# Patient Record
Sex: Male | Born: 1970 | Race: Black or African American | Hispanic: No | Marital: Single | Smoking: Current some day smoker
Health system: Southern US, Community
[De-identification: ages and names within clinical notes are randomized; demographics above are authoritative.]

## PROBLEM LIST (undated history)

## (undated) DIAGNOSIS — E785 Hyperlipidemia, unspecified: Secondary | ICD-10-CM

## (undated) DIAGNOSIS — N189 Chronic kidney disease, unspecified: Secondary | ICD-10-CM

## (undated) DIAGNOSIS — I10 Essential (primary) hypertension: Secondary | ICD-10-CM

## (undated) HISTORY — DX: Hyperlipidemia, unspecified: E78.5

---
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
## 2010-01-30 ENCOUNTER — Emergency Department (HOSPITAL_COMMUNITY)
Admission: EM | Admit: 2010-01-30 | Discharge: 2010-01-30 | Payer: Self-pay | Source: Home / Self Care | Admitting: Emergency Medicine

## 2010-02-04 ENCOUNTER — Emergency Department (HOSPITAL_COMMUNITY)
Admission: EM | Admit: 2010-02-04 | Discharge: 2010-02-04 | Payer: Self-pay | Source: Home / Self Care | Admitting: Emergency Medicine

## 2010-07-30 ENCOUNTER — Inpatient Hospital Stay (INDEPENDENT_AMBULATORY_CARE_PROVIDER_SITE_OTHER)
Admission: RE | Admit: 2010-07-30 | Discharge: 2010-07-30 | Disposition: A | Discharge status: Home / Self Care | Payer: 59 | Source: Ambulatory Visit | Attending: Emergency Medicine | Admitting: Emergency Medicine

## 2010-07-30 DIAGNOSIS — M722 Plantar fascial fibromatosis: Secondary | ICD-10-CM

## 2011-08-12 ENCOUNTER — Ambulatory Visit: Payer: 59 | Admitting: Physician Assistant

## 2011-08-12 VITALS — BP 122/78 | HR 60 | Temp 98.4°F | Resp 16 | Ht 67.5 in | Wt 184.0 lb

## 2011-08-12 DIAGNOSIS — M542 Cervicalgia: Secondary | ICD-10-CM

## 2011-08-12 DIAGNOSIS — L0211 Cutaneous abscess of neck: Secondary | ICD-10-CM

## 2011-08-12 DIAGNOSIS — L03221 Cellulitis of neck: Secondary | ICD-10-CM

## 2011-08-12 MED ORDER — DOXYCYCLINE HYCLATE 100 MG PO CAPS: 100.0000 mg | ORAL_CAPSULE | Freq: Two times a day (BID) | ORAL | Status: AC

## 2011-08-12 NOTE — Progress Notes (Signed)
  Subjective:    [name], male    DOB: Mar 05, 1970, 41 y.o.   MRN: [id number]  HPI Patient presents with bump on left side of her neck x 1 week. He first noticed it after getting his hair cut by a new barber.  Says it started as a small pimple bump but then has grown and increased in size.  A friend of his used a pair of tweezers to dig out a hair and after then it has gotten bigger with now a central area of pus.  No fevers, chills, neck pain, nausea, or vomiting.      Review of Systems  All other systems reviewed and are negative.       Objective:   Physical Exam  Constitutional: He is oriented to person, place, and time. He appears well-developed and well-nourished.  HENT:  Head: Normocephalic and atraumatic.  Right Ear: External ear normal.  Left Ear: External ear normal.  Eyes: Conjunctivae are normal.  Neck: Normal range of motion.  Cardiovascular: Normal rate, regular rhythm and normal heart sounds.   Pulmonary/Chest: Effort normal and breath sounds normal.  Musculoskeletal: Normal range of motion.  Lymphadenopathy:    He has no cervical adenopathy.  Neurological: He is alert and oriented to person, place, and time.  Skin:     Psychiatric: He has a normal mood and affect. His behavior is normal. Judgment and thought content normal.    [name] MRN: [id number], DOB: [birth year], 41 y.o. Date of Encounter: 08/12/2011, 8:05 PM    PROCEDURE NOTE: Verbal consent obtained. Betadine prep per usual protocol. Local anesthesia obtained with 2% lidocaine plain.  1 cm incision made with 11 blade along lesion.  Culture taken. Minimal purulence expressed. Lesion explored revealing no loculations. Dressed. Wound care instructions including precautions with patient. Patient tolerated the procedure well. Follow up as needed.      Grier Mitts, PA-C 08/12/2011 8:05 PM       Assessment & Plan:   1. Cellulitis, neck  doxycycline  (VIBRAMYCIN) 100 MG capsule, Wound culture   Start doxycycline 100 mg bid x 10 days.  Warm compresses to area.   Follow up if symptoms worsening or fail to improve.

## 2011-08-16 LAB — WOUND CULTURE
Gram Stain: NONE SEEN
Gram Stain: NONE SEEN
Gram Stain: NONE SEEN

## 2011-12-12 ENCOUNTER — Emergency Department (HOSPITAL_COMMUNITY)
Admission: EM | Admit: 2011-12-12 | Discharge: 2011-12-12 | Disposition: A | Discharge status: Home / Self Care | Payer: 59 | Attending: Emergency Medicine | Admitting: Emergency Medicine

## 2011-12-12 ENCOUNTER — Encounter (HOSPITAL_COMMUNITY): Payer: Self-pay | Admitting: *Deleted

## 2011-12-12 DIAGNOSIS — L03211 Cellulitis of face: Secondary | ICD-10-CM | POA: Insufficient documentation

## 2011-12-12 DIAGNOSIS — H5789 Other specified disorders of eye and adnexa: Secondary | ICD-10-CM | POA: Insufficient documentation

## 2011-12-12 DIAGNOSIS — F17200 Nicotine dependence, unspecified, uncomplicated: Secondary | ICD-10-CM | POA: Insufficient documentation

## 2011-12-12 DIAGNOSIS — L03213 Periorbital cellulitis: Secondary | ICD-10-CM

## 2011-12-12 DIAGNOSIS — L0201 Cutaneous abscess of face: Secondary | ICD-10-CM | POA: Insufficient documentation

## 2011-12-12 MED ORDER — CEPHALEXIN 500 MG PO CAPS: 500.0000 mg | ORAL_CAPSULE | Freq: Four times a day (QID) | ORAL | Status: DC

## 2011-12-12 MED ORDER — PREDNISONE 10 MG PO TABS: 20.0000 mg | ORAL_TABLET | Freq: Two times a day (BID) | ORAL | Status: DC

## 2011-12-12 NOTE — ED Notes (Signed)
Patient has swelling and redness to the right eye.  He states he noted onset of itching in the eye.  On Friday the eye was irritated and the swelling has progressed.  Patient states he has not drainage.  Denies pain.  Patient works in a nursing home.  He does not recall for sure if a foreign substance from a resident may have gotten in his face

## 2011-12-12 NOTE — Discharge Instructions (Signed)
Keflex and prednisone as prescribed.  Also take benadryl 50 mg three times daily for the next three days.  This medication may make you drowsy so avoid driving while taking this.   Orbital Cellulitis  Orbital cellulitis is an infection of the soft tissue of the orbit without abscess formation. The eye socket is the area around and behind the eye. It usually comes on suddenly in children, but can happen at any age. This condition can lead to death if untreated.  CAUSES   A germ (bacterial) infection of the area around and behind the eye.  Long-term (chronic) sinus infections.  An object (foreign body) stuck behind the eye.  An injury that goes through (penetrates) to tissues of the eyelids.  Trauma with secondary infection.  Fracture of the boney wall or floor of the orbit.  Serious eyelid infections.  Bite wounds.  Inflammation or infection of the lining membranes of the brain (meningitis).  Blood poisoning or infection (septicemia).  Dental area filled with pus (abscesses).  Severe uncontrolled diabetes (ketoacidosis). SYMPTOMS   Pain in the eye.  Redness and puffiness (swelling) of the eyelids. The swelling is often bad enough that the eye has to close.  Fever and feeling generally ill.  The lids and the cheek may be very red, hot and swollen.  A drop in vision.  Pain when touching the area around the eye.  The eye itself may look like it is "popping out" (proptosis).  Double vision  seeing two of everything (diplopia). DIAGNOSIS  An ophthalmologist can tell you if you have orbital cellulitis during an eye exam. It is important to know if the infection goes into the area behind the eye. True orbital cellulitis is a medical emergency. A CT scan may be needed to see if the sinuses are involved. A CT scan can also see if an abscess has formed behind the eye. TREATMENT  Orbital cellulitis should be treated in the hospital with medicine that kills germs (antibiotics).  These antibiotics are given right into the bloodstream through a vein (intravenous). SEEK IMMEDIATE MEDICAL CARE IF:   You have red, swollen eyelids.  You have a fever.  You develop double vision. MAKE SURE YOU:   Understand these instructions.  Will watch your condition.  Will get help right away if you are not doing well or get worse. Document Released: 12/29/2000 Document Revised: 03/29/2011 Document Reviewed: 05/01/2007 [facility] Patient Information 2013 [place], [place].

## 2011-12-12 NOTE — ED Provider Notes (Signed)
History  This chart was scribed for Geoffery Lyons, MD by Shari Heritage, ED Scribe. The patient was seen in room TR06C/TR06C. Patient's care was started at 1101.   CSN: [id number]  Arrival date & time 12/12/11  4098   First MD Initiated Contact with Patient 12/12/11 1101      Chief Complaint  Patient presents with  . Facial Swelling    The history is provided by the patient. No language interpreter was used.   HPI Comments: Junie Gulbrandson is a 41 y.o. male who presents to the Emergency Department complaining of right eyelid swelling, redness and itching with mild tearing onset 3 days ago. Patient denies any pain, blurry vision, double vision or other vision changes. He is having no symptoms in his left eye. He says that he was riding in his truck bed 6 days ago trimming hedges when a piece of plant brush scratched his face. Current eye symptoms did not begin until 3 days after the incident. Patient reports no significant past medical history. He is a current every day smoker.   Past Surgical History  Procedure Date  . Mastectomy     No family history on file.  History  Substance Use Topics  . Smoking status: Current Every Day Smoker    Types: Cigars  . Smokeless tobacco: Not on file  . Alcohol Use: Yes      Review of Systems  Eyes: Positive for discharge (mild tearing), redness and itching. Negative for photophobia, pain and visual disturbance.  All other systems reviewed and are negative.    Allergies  Review of patient's allergies indicates no known allergies.  Home Medications  No current outpatient prescriptions on file.  Triage Vitals: BP 148/92  Pulse 64  Temp 97.7 F (36.5 C) (Oral)  Resp 16  Ht 5' 7.5" (1.715 m)  Wt 179 lb (81.194 kg)  BMI 27.62 kg/m2  SpO2 98%  Physical Exam  Constitutional: He is oriented to person, place, and time. He appears well-developed and well-nourished. No distress.  HENT:  Head: Normocephalic and atraumatic.  Eyes: Pupils  are equal, round, and reactive to light.       Swelling and erythema to right periorbital area of eye. Somewhat warm to the touch. No pain or diplopia with EOM movements. Eyeball itself is without redness or evidence for corneal abrasion. PERRL.  Neck: Normal range of motion.  Cardiovascular: Normal rate.   Pulmonary/Chest: Effort normal.  Musculoskeletal: Normal range of motion.  Neurological: He is alert and oriented to person, place, and time.  Skin: Skin is warm and dry.  Psychiatric: He has a normal mood and affect. His behavior is normal.    ED Course  Procedures (including critical care time) DIAGNOSTIC STUDIES: Oxygen Saturation is 98% on room air, normal by my interpretation.    COORDINATION OF CARE: 11:08 AM- Patient informed of current plan for treatment and evaluation and agrees with plan at this time.    No diagnosis found.    MDM  I am unsure whether this is periorbital (definitely not orbital) or a contact dermatitis.  As this is very close proximity of the eye, I will treat for both with keflex and prednisone/benadryl.      I personally performed the services described in this documentation, which was scribed in my presence. The recorded information has been reviewed and is accurate.     Geoffery Lyons, MD 12/12/11 1149

## 2012-03-04 ENCOUNTER — Other Ambulatory Visit: Payer: Self-pay

## 2012-11-23 ENCOUNTER — Other Ambulatory Visit: Payer: Self-pay

## 2013-05-03 ENCOUNTER — Ambulatory Visit: Payer: 59

## 2013-05-03 ENCOUNTER — Ambulatory Visit: Payer: 59 | Admitting: Family Medicine

## 2013-05-03 VITALS — BP 112/70 | HR 83 | Temp 97.9°F | Resp 16 | Ht 68.5 in | Wt 174.0 lb

## 2013-05-03 DIAGNOSIS — M542 Cervicalgia: Secondary | ICD-10-CM

## 2013-05-03 DIAGNOSIS — S161XXA Strain of muscle, fascia and tendon at neck level, initial encounter: Secondary | ICD-10-CM

## 2013-05-03 DIAGNOSIS — M25519 Pain in unspecified shoulder: Secondary | ICD-10-CM

## 2013-05-03 DIAGNOSIS — S139XXA Sprain of joints and ligaments of unspecified parts of neck, initial encounter: Secondary | ICD-10-CM

## 2013-05-03 DIAGNOSIS — S46912A Strain of unspecified muscle, fascia and tendon at shoulder and upper arm level, left arm, initial encounter: Secondary | ICD-10-CM

## 2013-05-03 DIAGNOSIS — IMO0002 Reserved for concepts with insufficient information to code with codable children: Secondary | ICD-10-CM

## 2013-05-03 MED ORDER — METHOCARBAMOL 500 MG PO TABS: 500.0000 mg | ORAL_TABLET | Freq: Every evening | ORAL | Status: DC | PRN

## 2013-05-03 MED ORDER — IBUPROFEN 800 MG PO TABS: 800.0000 mg | ORAL_TABLET | Freq: Three times a day (TID) | ORAL | Status: DC

## 2013-05-03 NOTE — Patient Instructions (Signed)

## 2013-05-03 NOTE — Progress Notes (Addendum)
Subjective:    [name], male    DOB: 08/07/1970, 43 y.o.   MRN: [id number]  HPI This chart was scribed for Dave Jimenez by Dave HousemanFallon Jimenez, Scribe. This patient was seen in room 3 and the patient's care was started at 2:24 PM.  HPI Comments: Dave Jimenez is a 43 y.o. male who presents to the [facility] complaining of constant worsening left elbow pain and L shoulder pain that started 6 days ago.  He states he was lifting his lawn mower when he dropped it towards his left side.  Pt states pain immediately onset, but has rapidly worsened over the past few days.  He denies hearing or feeling a "pop" at the time of the injury.  Pt states the pain is radiating into his left shoulder and down his forearm.  Pt reports he is experiencing weakness and some tingling in his left arm.  He reports he had a previous injury to his left elbow in 1989 with fracture, but denies any further complications or chronic pain.  Pt denies this pain as similar to his previous pain.  Pt reports he hasn't tried OTC medications or heat for relief.  Pt denies taking any daily medications.  Pt states he stays active by playing basketball during the week.  He denies having a PCP.    Past Surgical History  Procedure Laterality Date  . Mastectomy      Family History  Problem Relation Age of Onset  . Hypertension Mother   . Heart disease Father     History   Social History  . Marital Status: Single    Spouse Name: N/A    Number of Children: N/A  . Years of Education: N/A   Occupational History  . Not on file.   Social History Main Topics  . Smoking status: Current Every Day Smoker    Types: Cigars  . Smokeless tobacco: Not on file  . Alcohol Use: Yes  . Drug Use: Yes    Special: Marijuana  . Sexual Activity: Not on file   Other Topics Concern  . Not on file   Social History Narrative  . No narrative on file    No Known Allergies  There are no active problems to  display for this patient.   Review of Systems  Constitutional: Negative for fever and chills.  Respiratory: Negative for cough and shortness of breath.   Cardiovascular: Negative for chest pain.  Gastrointestinal: Negative for nausea, vomiting, abdominal pain and diarrhea.  Musculoskeletal: Positive for arthralgias (Left shoulder ). Negative for joint swelling, neck pain and neck stiffness.  Skin: Negative for color change, rash and wound.  Neurological: Positive for weakness. Negative for numbness.  Psychiatric/Behavioral: Negative for behavioral problems and confusion.      Objective:   Physical Exam  Nursing note and vitals reviewed. Constitutional: He is oriented to person, place, and time. He appears well-developed and well-nourished. No distress.  HENT:  Head: Normocephalic and atraumatic.  Eyes: Conjunctivae and EOM are normal. Right eye exhibits no discharge. Left eye exhibits no discharge.  Neck: Normal range of motion. Neck supple. No tracheal deviation present.  Cardiovascular: Normal rate, regular rhythm, normal heart sounds and intact distal pulses.  Exam reveals no gallop and no friction rub.   No murmur heard. Pulmonary/Chest: Effort normal and breath sounds normal. No respiratory distress. He has no wheezes. He has no rales.  Abdominal: Soft. He exhibits no distension.  Musculoskeletal: Normal  range of motion. He exhibits tenderness. He exhibits no edema.       Right shoulder: Normal.       Left shoulder: He exhibits tenderness and pain. He exhibits normal range of motion, no swelling, no spasm, normal pulse and normal strength.       Cervical back: He exhibits tenderness, pain and spasm. He exhibits normal range of motion, no bony tenderness and normal pulse.  No midline tenderness.  Left paraspinal muscles tender.  Left trapezius tenderness.  Posterior shoulder TTP.  Anterior shoulder tenderness along the bicep groove.  Triceps tender distally, but no obvious defect.   Full extension and flexion of elbow.  Empty can sign negative.  Cross over positive.  Normal supination and pronation.    Neurological: He is alert and oriented to person, place, and time.  Skin: Skin is warm and dry. No rash noted.  Psychiatric: He has a normal mood and affect. His behavior is normal. Judgment and thought content normal.    Filed Vitals:   05/03/13 1317  BP: 112/70  Pulse: 83  Temp: 97.9 F (36.6 C)  TempSrc: Oral  Resp: 16  Height: 5' 8.5" (1.74 m)  Weight: 174 lb (78.926 kg)  SpO2: 99%   DIAGNOSTIC STUDIES: Oxygen Saturation is 99% on RA, normal by my interpretation.    COORDINATION OF CARE: 2:34 PM-Will order x-ray of left shoulder and cervical spine.  Patient informed of current plan of treatment and evaluation and agrees with plan.    [facility] reading (PRIMARY) by  Dr. Katrinka BlazingSmith.  L SHOULDER:  NAD; CERVICAL SPINE: NAD.      Assessment & Plan:  Pain in joint, shoulder region - Plan: DG Shoulder Left  Neck pain - Plan: DG Cervical Spine 2 or 3 views  Strain of left shoulder  Strain of neck  1.  Pain/strain neck: New.  Rx for Ibuprofen 800mg , Robaxin 500mg ; home exercise program provided; heat to area bid.  Avoid heavy lifting for the next two weeks. 2.  L shoulder pain/strain:  New.  Rx provided.  Home exercise program provided.  Recommend rest, avoid lifting for the next two weeks. If no improvement in two weeks, call for ortho referral.  Meds ordered this encounter  Medications  . ibuprofen (ADVIL,MOTRIN) 800 MG tablet    Sig: Take 1 tablet (800 mg total) by mouth 3 (three) times daily.    Dispense:  30 tablet    Refill:  0  . methocarbamol (ROBAXIN) 500 MG tablet    Sig: Take 1-2 tablets (500-1,000 mg total) by mouth at bedtime as needed for muscle spasms.    Dispense:  40 tablet    Refill:  0   I personally performed the services described in this documentation, which was scribed in my presence.  The recorded information has been reviewed and is  accurate.  Dave SimmerKristi Dawaun Jimenez, M.D.  [facility]  [place] [address] [place], [place]  [id number] [phone] phone [phone] fax

## 2013-10-02 ENCOUNTER — Emergency Department (HOSPITAL_COMMUNITY)
Admission: EM | Admit: 2013-10-02 | Discharge: 2013-10-02 | Disposition: A | Discharge status: Home / Self Care | Payer: 59 | Attending: Emergency Medicine | Admitting: Emergency Medicine

## 2013-10-02 ENCOUNTER — Encounter (HOSPITAL_COMMUNITY): Payer: Self-pay | Admitting: Emergency Medicine

## 2013-10-02 DIAGNOSIS — Y9289 Other specified places as the place of occurrence of the external cause: Secondary | ICD-10-CM | POA: Diagnosis not present

## 2013-10-02 DIAGNOSIS — IMO0002 Reserved for concepts with insufficient information to code with codable children: Secondary | ICD-10-CM | POA: Diagnosis present

## 2013-10-02 DIAGNOSIS — S335XXA Sprain of ligaments of lumbar spine, initial encounter: Secondary | ICD-10-CM | POA: Diagnosis not present

## 2013-10-02 DIAGNOSIS — X503XXA Overexertion from repetitive movements, initial encounter: Secondary | ICD-10-CM | POA: Diagnosis not present

## 2013-10-02 DIAGNOSIS — F17200 Nicotine dependence, unspecified, uncomplicated: Secondary | ICD-10-CM | POA: Diagnosis not present

## 2013-10-02 DIAGNOSIS — Y9389 Activity, other specified: Secondary | ICD-10-CM | POA: Insufficient documentation

## 2013-10-02 DIAGNOSIS — S39012A Strain of muscle, fascia and tendon of lower back, initial encounter: Secondary | ICD-10-CM

## 2013-10-02 DIAGNOSIS — X500XXA Overexertion from strenuous movement or load, initial encounter: Secondary | ICD-10-CM | POA: Diagnosis not present

## 2013-10-02 MED ORDER — IBUPROFEN 800 MG PO TABS: 800.0000 mg | ORAL_TABLET | Freq: Three times a day (TID) | ORAL | Status: DC

## 2013-10-02 MED ORDER — CYCLOBENZAPRINE HCL 10 MG PO TABS: 10.0000 mg | ORAL_TABLET | Freq: Two times a day (BID) | ORAL | Status: DC | PRN

## 2013-10-02 NOTE — ED Provider Notes (Signed)
CSN: [id number]     Arrival date & time 10/02/13  1733 History   First MD Initiated Contact with Patient 10/02/13 1738     This chart was scribed for non-physician practitioner, Marlon Pel PA-C working with Merrie Roof, * by Arlan Organ, ED Scribe. This patient was seen in room WTR6/WTR6 and the patient's care was started at 7:09 PM.   Chief Complaint  Patient presents with  . Back Pain   The history is provided by the patient. No language interpreter was used.    HPI Comments: Dave Jimenez is a 43 y.o. male who presents to the Emergency Department complaining of constant, moderate back pain onset 2 day after waking from sleep. Pt states pain has gradually worsened since time of onset. He attributes pain to lifting his lawn mower off of his truck. [name] has not tried any OTC medications or any home remedies to help manage symptoms. Pt denies any fever, dysuria, or chills. No weakness, paresthesia, or loss of sensation to lower extremitites. He denies any bowel or urinary incontinence. No IV drug use. No known allergies to medications.  History reviewed. No pertinent past medical history. Past Surgical History  Procedure Laterality Date  . Mastectomy     Family History  Problem Relation Age of Onset  . Hypertension Mother   . Heart disease Father    History  Substance Use Topics  . Smoking status: Current Every Day Smoker    Types: Cigars  . Smokeless tobacco: Not on file  . Alcohol Use: Yes    Review of Systems  Constitutional: Negative for fever and chills.  Genitourinary: Negative for dysuria.  Musculoskeletal: Positive for back pain.  Neurological: Negative for weakness and numbness.  All other systems reviewed and are negative.     Allergies  Review of patient's allergies indicates no known allergies.  Home Medications   Prior to Admission medications   Medication Sig Start Date End Date Taking? Authorizing Provider  cyclobenzaprine  (FLEXERIL) 10 MG tablet Take 1 tablet (10 mg total) by mouth 2 (two) times daily as needed for muscle spasms. 10/02/13   Rubyann Lingle Irine Seal, PA-C  ibuprofen (ADVIL,MOTRIN) 800 MG tablet Take 1 tablet (800 mg total) by mouth 3 (three) times daily. 10/02/13   Dorthula Matas, PA-C    Physical Exam  Nursing note and vitals reviewed. Constitutional: He is oriented to person, place, and time. He appears well-developed and well-nourished.  HENT:  Head: Normocephalic.  Eyes: EOM are normal.  Neck: Normal range of motion.  Pulmonary/Chest: Effort normal.  Abdominal: He exhibits no distension.  Musculoskeletal: Normal range of motion.       Back:  Pt has equal strength to bilateral lower extremities.  Neurosensory function adequate to both legs No clonus on dorsiflextion Skin color is normal. Skin is warm and moist.  I see no step off deformity, no midline bony tenderness.  Pt is able to ambulate.  No crepitus, laceration, effusion, induration, lesions, swelling.   Pedal pulses are symmetrical and palpable bilaterally   tenderness to palpation of paraspinel muscle on left side   Neurological: He is alert and oriented to person, place, and time.  Psychiatric: He has a normal mood and affect.    ED Course  Procedures (including critical care time)  DIAGNOSTIC STUDIES: Oxygen Saturation is 97% on RA, Adequate by my interpretation.    COORDINATION OF CARE: 7:09 PM- Will give Flexeril and Motrin at discharge to manage symptoms. Discussed treatment  plan with pt at bedside and pt agreed to plan.     Labs Review Labs Reviewed - No data to display  Imaging Review No results found.   EKG Interpretation None      MDM   Final diagnoses:  Low back strain, initial encounter    43 y.o.[name]'s  with back pain. No neurological deficits and normal neuro exam. Patient can walk. No loss of bowel or bladder control. No concern for cauda equina at this time base on HPI and physical exam  findings. No fever, night sweats, weight loss, h/o cancer, IVDU.   RICE protocol and pain medicine indicated and discussed with patient.   Patient Plan 1. Medications: pain medication and muscle relaxer. Cont usual home medications unless otherwise directed. 2. Treatment: rest, drink plenty of fluids, gentle stretching as discussed, alternate ice and heat  3. Follow Up: Please followup with your primary doctor for discussion of your diagnoses and further evaluation after today's visit; if you do not have a primary care doctor use the resource guide provided to find one   Vital signs are stable at discharge. Filed Vitals:   10/02/13 1738  BP: 144/97  Pulse: 70  Temp: 98.2 F (36.8 C)  Resp: 18    Patient/guardian has voiced understanding and agreed to follow-up with the PCP or specialist.   I personally performed the services described in this documentation, which was scribed in my presence. The recorded information has been reviewed and is accurate.      Dorthula Matas, PA-C 10/02/13 1910

## 2013-10-02 NOTE — ED Notes (Signed)
Pt reports lifting a lawn mower off of his truck Saturday, started to have low back pain the next day.

## 2013-10-02 NOTE — ED Notes (Signed)
Pt states that he lifted his lawn mower off truck yesterday and then when he woke up today he has lower back pain.

## 2013-10-02 NOTE — Discharge Instructions (Signed)
Back Pain, Adult Low back pain is very common. About 1 in 5 people have back pain.The cause of low back pain is rarely dangerous. The pain often gets better over time.About half of people with a sudden onset of back pain feel better in just 2 weeks. About 8 in 10 people feel better by 6 weeks.  CAUSES Some common causes of back pain include:  Strain of the muscles or ligaments supporting the spine.  Wear and tear (degeneration) of the spinal discs.  Arthritis.  Direct injury to the back. DIAGNOSIS Most of the time, the direct cause of low back pain is not known.However, back pain can be treated effectively even when the exact cause of the pain is unknown.Answering your caregiver's questions about your overall health and symptoms is one of the most accurate ways to make sure the cause of your pain is not dangerous. If your caregiver needs more information, he or she may order lab work or imaging tests (X-rays or MRIs).However, even if imaging tests show changes in your back, this usually does not require surgery. HOME CARE INSTRUCTIONS For many people, back pain returns.Since low back pain is rarely dangerous, it is often a condition that people can learn to manageon their own.   Remain active. It is stressful on the back to sit or stand in one place. Do not sit, drive, or stand in one place for more than 30 minutes at a time. Take short walks on level surfaces as soon as pain allows.Try to increase the length of time you walk each day.  Do not stay in bed.Resting more than 1 or 2 days can delay your recovery.  Do not avoid exercise or work.Your body is made to move.It is not dangerous to be active, even though your back may hurt.Your back will likely heal faster if you return to being active before your pain is gone.  Pay attention to your body when you bend and lift. Many people have less discomfortwhen lifting if they bend their knees, keep the load close to their bodies,and  avoid twisting. Often, the most comfortable positions are those that put less stress on your recovering back.  Find a comfortable position to sleep. Use a firm mattress and lie on your side with your knees slightly bent. If you lie on your back, put a pillow under your knees.  Only take over-the-counter or prescription medicines as directed by your caregiver. Over-the-counter medicines to reduce pain and inflammation are often the most helpful.Your caregiver may prescribe muscle relaxant drugs.These medicines help dull your pain so you can more quickly return to your normal activities and healthy exercise.  Put ice on the injured area.  Put ice in a plastic bag.  Place a towel between your skin and the bag.  Leave the ice on for 15-20 minutes, 03-04 times a day for the first 2 to 3 days. After that, ice and heat may be alternated to reduce pain and spasms.  Ask your caregiver about trying back exercises and gentle massage. This may be of some benefit.  Avoid feeling anxious or stressed.Stress increases muscle tension and can worsen back pain.It is important to recognize when you are anxious or stressed and learn ways to manage it.Exercise is a great option. SEEK MEDICAL CARE IF:  You have pain that is not relieved with rest or medicine.  You have pain that does not improve in 1 week.  You have new symptoms.  You are generally not feeling well. SEEK   IMMEDIATE MEDICAL CARE IF:   You have pain that radiates from your back into your legs.  You develop new bowel or bladder control problems.  You have unusual weakness or numbness in your arms or legs.  You develop nausea or vomiting.  You develop abdominal pain.  You feel faint. Document Released: 01/04/2005 Document Revised: 07/06/2011 Document Reviewed: 05/08/2013 [employer] Patient Information 2015 [employer]. This information is not intended to replace advice given to you by your health care provider. Make sure you  discuss any questions you have with your health care provider.  

## 2013-10-05 NOTE — ED Provider Notes (Signed)
Medical screening examination/treatment/procedure(s) were performed by non-physician practitioner and as supervising physician I was immediately available for consultation/collaboration.   EKG Interpretation None        Candyce Churn III, MD 10/05/13 [phone]

## 2015-03-03 ENCOUNTER — Encounter (HOSPITAL_COMMUNITY): Payer: Self-pay | Admitting: Emergency Medicine

## 2015-03-03 ENCOUNTER — Emergency Department (HOSPITAL_COMMUNITY)
Admission: EM | Admit: 2015-03-03 | Discharge: 2015-03-03 | Discharge status: Against Medical Advice | Payer: Managed Care, Other (non HMO) | Attending: Emergency Medicine | Admitting: Emergency Medicine

## 2015-03-03 ENCOUNTER — Emergency Department (HOSPITAL_COMMUNITY): Payer: Managed Care, Other (non HMO)

## 2015-03-03 DIAGNOSIS — F17210 Nicotine dependence, cigarettes, uncomplicated: Secondary | ICD-10-CM | POA: Diagnosis not present

## 2015-03-03 DIAGNOSIS — T59811A Toxic effect of smoke, accidental (unintentional), initial encounter: Secondary | ICD-10-CM

## 2015-03-03 DIAGNOSIS — Z791 Long term (current) use of non-steroidal anti-inflammatories (NSAID): Secondary | ICD-10-CM | POA: Diagnosis not present

## 2015-03-03 DIAGNOSIS — J705 Respiratory conditions due to smoke inhalation: Secondary | ICD-10-CM | POA: Insufficient documentation

## 2015-03-03 NOTE — ED Notes (Signed)
Pt is refusing labs, chest xray completed

## 2015-03-03 NOTE — ED Notes (Signed)
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  States that none of his smoke alarms went off.  States that he had a cold before hand.  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  States that when he was out in the fresh air, he felt better.
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
## 2015-03-03 NOTE — ED Notes (Addendum)
Last BP checked with different BP machine. Pt strongly encouraged to recheck his BP at work today. Pt stated that he has no hx of hypertension. Pt again refused lab work. PA advised of las vital signs Pt encouraged to return for chest pain, shortness of breath or dizziness

## 2016-05-04 ENCOUNTER — Encounter (HOSPITAL_COMMUNITY): Payer: Self-pay | Admitting: Emergency Medicine

## 2016-05-04 ENCOUNTER — Emergency Department (HOSPITAL_COMMUNITY)
Admission: EM | Admit: 2016-05-04 | Discharge: 2016-05-04 | Disposition: A | Discharge status: Home / Self Care | Payer: Managed Care, Other (non HMO) | Attending: Emergency Medicine | Admitting: Emergency Medicine

## 2016-05-04 DIAGNOSIS — M545 Low back pain: Secondary | ICD-10-CM | POA: Diagnosis present

## 2016-05-04 DIAGNOSIS — M5441 Lumbago with sciatica, right side: Secondary | ICD-10-CM | POA: Insufficient documentation

## 2016-05-04 DIAGNOSIS — F17290 Nicotine dependence, other tobacco product, uncomplicated: Secondary | ICD-10-CM | POA: Diagnosis not present
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
## 2016-05-04 NOTE — Discharge Instructions (Signed)

## 2016-05-13 ENCOUNTER — Ambulatory Visit: Payer: Managed Care, Other (non HMO) | Attending: Internal Medicine | Admitting: Physician Assistant

## 2016-05-13 ENCOUNTER — Ambulatory Visit (HOSPITAL_COMMUNITY)
Admission: RE | Admit: 2016-05-13 | Discharge: 2016-05-13 | Disposition: A | Discharge status: Home / Self Care | Payer: Managed Care, Other (non HMO) | Source: Ambulatory Visit | Attending: Physician Assistant | Admitting: Physician Assistant

## 2016-05-13 VITALS — BP 144/94 | HR 79 | Temp 98.2°F | Ht 67.5 in | Wt 176.6 lb

## 2016-05-13 DIAGNOSIS — W010XXA Fall on same level from slipping, tripping and stumbling without subsequent striking against object, initial encounter: Secondary | ICD-10-CM | POA: Diagnosis not present

## 2016-05-13 DIAGNOSIS — S335XXA Sprain of ligaments of lumbar spine, initial encounter: Secondary | ICD-10-CM

## 2016-05-13 DIAGNOSIS — M5116 Intervertebral disc disorders with radiculopathy, lumbar region: Secondary | ICD-10-CM | POA: Diagnosis not present

## 2016-05-13 DIAGNOSIS — M545 Low back pain: Secondary | ICD-10-CM | POA: Insufficient documentation

## 2016-05-13 DIAGNOSIS — R030 Elevated blood-pressure reading, without diagnosis of hypertension: Secondary | ICD-10-CM

## 2016-05-13 DIAGNOSIS — M4726 Other spondylosis with radiculopathy, lumbar region: Secondary | ICD-10-CM | POA: Insufficient documentation

## 2016-05-13 DIAGNOSIS — M5416 Radiculopathy, lumbar region: Secondary | ICD-10-CM | POA: Diagnosis present

## 2016-05-13 MED ORDER — METHOCARBAMOL 500 MG PO TABS: 500.0000 mg | ORAL_TABLET | Freq: Three times a day (TID) | ORAL | 0 refills | Status: DC

## 2016-05-13 MED ORDER — NAPROXEN 500 MG PO TBEC: 500.0000 mg | DELAYED_RELEASE_TABLET | Freq: Two times a day (BID) | ORAL | 0 refills | Status: DC

## 2016-05-13 NOTE — Patient Instructions (Signed)
Check blood pressure out of the office 3 and record and bring to next visit.
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
## 2016-05-18 ENCOUNTER — Other Ambulatory Visit: Payer: Self-pay | Admitting: Pharmacist

## 2016-05-18 MED ORDER — NAPROXEN 500 MG PO TABS: ORAL_TABLET | ORAL | 0 refills | Status: DC

## 2016-05-24 ENCOUNTER — Telehealth: Payer: Self-pay

## 2016-05-24 NOTE — Telephone Encounter (Signed)
Pt. Called stating that Marylene Landngela has wrote him a letter for his job. His job is asking for another letter because the current letter states for him to be on light duty for 10 days and the 10 days are over. Pt. Has not been able to work because the letter does not specify what he needs to do after the 10 days. Please f/u  Letter can be faxed at [phone] attn. Rhae [place]kee

## 2016-05-25 NOTE — Telephone Encounter (Signed)
Will forward to angela 

## 2016-05-27 ENCOUNTER — Ambulatory Visit: Payer: Managed Care, Other (non HMO)

## 2016-05-27 NOTE — Telephone Encounter (Signed)
Dave Jimenez,  He has a follow-up appointment on Monday, May 14th.  Please write him a note to continue light duty from the date I saw him until after he returns for follow-up on Monday where further assessment and advisement will be given.  Melvyn Novashanks,Angela McClung, PA-C

## 2016-05-31 ENCOUNTER — Encounter: Payer: Self-pay | Admitting: Family Medicine

## 2016-05-31 ENCOUNTER — Ambulatory Visit: Payer: Managed Care, Other (non HMO) | Attending: Family Medicine | Admitting: Family Medicine

## 2016-05-31 VITALS — BP 135/80 | HR 63 | Temp 98.2°F | Resp 18 | Ht 67.0 in | Wt 180.4 lb

## 2016-05-31 DIAGNOSIS — X58XXXS Exposure to other specified factors, sequela: Secondary | ICD-10-CM | POA: Insufficient documentation

## 2016-05-31 DIAGNOSIS — I10 Essential (primary) hypertension: Secondary | ICD-10-CM | POA: Insufficient documentation

## 2016-05-31 DIAGNOSIS — S335XXS Sprain of ligaments of lumbar spine, sequela: Secondary | ICD-10-CM | POA: Insufficient documentation

## 2016-05-31 LAB — POCT UA - MICROALBUMIN
Creatinine, POC: 200 mg/dL
Microalbumin Ur, POC: 30 mg/L

## 2016-05-31 MED ORDER — NAPROXEN 500 MG PO TABS: ORAL_TABLET | ORAL | 0 refills | Status: DC

## 2016-05-31 MED ORDER — HYDROCHLOROTHIAZIDE 25 MG PO TABS: 12.5000 mg | ORAL_TABLET | Freq: Every day | ORAL | 2 refills | Status: DC

## 2016-05-31 NOTE — Patient Instructions (Signed)
Schedule lab visit  Hypertension Hypertension is another name for high blood pressure. High blood pressure forces your heart to work harder to pump blood. This can cause problems over time. There are two numbers in a blood pressure reading. There is a top number (systolic) over a bottom number (diastolic). It is best to have a blood pressure below 120/80. Healthy choices can help lower your blood pressure. You may need medicine to help lower your blood pressure if:  Your blood pressure cannot be lowered with healthy choices.  Your blood pressure is higher than 130/80. Follow these instructions at home: Eating and drinking   If directed, follow the DASH eating plan. This diet includes:  Filling half of your plate at each meal with fruits and vegetables.  Filling one quarter of your plate at each meal with whole grains. Whole grains include whole wheat pasta, brown rice, and whole grain bread.  Eating or drinking low-fat dairy products, such as skim milk or low-fat yogurt.  Filling one quarter of your plate at each meal with low-fat (lean) proteins. Low-fat proteins include fish, skinless chicken, eggs, beans, and tofu.  Avoiding fatty meat, cured and processed meat, or chicken with skin.  Avoiding premade or processed food.  Eat less than 1,500 mg of salt (sodium) a day.  Limit alcohol use to no more than 1 drink a day for nonpregnant women and 2 drinks a day for men. One drink equals 12 oz of beer, 5 oz of wine, or 1 oz of hard liquor. Lifestyle   Work with your doctor to stay at a healthy weight or to lose weight. Ask your doctor what the best weight is for you.  Get at least 30 minutes of exercise that causes your heart to beat faster (aerobic exercise) most days of the week. This may include walking, swimming, or biking.  Get at least 30 minutes of exercise that strengthens your muscles (resistance exercise) at least 3 days a week. This may include lifting weights or  pilates.  Do not use any products that contain nicotine or tobacco. This includes cigarettes and e-cigarettes. If you need help quitting, ask your doctor.  Check your blood pressure at home as told by your doctor.  Keep all follow-up visits as told by your doctor. This is important. Medicines   Take over-the-counter and prescription medicines only as told by your doctor. Follow directions carefully.  Do not skip doses of blood pressure medicine. The medicine does not work as well if you skip doses. Skipping doses also puts you at risk for problems.  Ask your doctor about side effects or reactions to medicines that you should watch for. Contact a doctor if:  You think you are having a reaction to the medicine you are taking.  You have headaches that keep coming back (recurring).  You feel dizzy.  You have swelling in your ankles.  You have trouble with your vision. Get help right away if:  You get a very bad headache.  You start to feel confused.  You feel weak or numb.  You feel faint.  You get very bad pain in your:  Chest.  Belly (abdomen).  You throw up (vomit) more than once.  You have trouble breathing. Summary  Hypertension is another name for high blood pressure.  Making healthy choices can help lower blood pressure. If your blood pressure cannot be controlled with healthy choices, you may need to take medicine. This information is not intended to replace advice given  to you by your health care provider. Make sure you discuss any questions you have with your health care provider. Document Released: 06/23/2007 Document Revised: 12/03/2015 Document Reviewed: 12/03/2015 Elsevier Interactive Patient Education  2017 [employer].  Back Pain, Adult Back pain is very common. The pain often gets better over time. The cause of back pain is usually not dangerous. Most people can learn to manage their back pain on their own. Follow these instructions at  home: Watch your back pain for any changes. The following actions may help to lessen any pain you are feeling:  Stay active. Start with short walks on flat ground if you can. Try to walk farther each day.  Exercise regularly as told by your doctor. Exercise helps your back heal faster. It also helps avoid future injury by keeping your muscles strong and flexible.  Do not sit, drive, or stand in one place for more than 30 minutes.  Do not stay in bed. Resting more than 1-2 days can slow down your recovery.  Be careful when you bend or lift an object. Use good form when lifting:  Bend at your knees.  Keep the object close to your body.  Do not twist.  Sleep on a firm mattress. Lie on your side, and bend your knees. If you lie on your back, put a pillow under your knees.  Take medicines only as told by your doctor.  Put ice on the injured area.  Put ice in a plastic bag.  Place a towel between your skin and the bag.  Leave the ice on for 20 minutes, 2-3 times a day for the first 2-3 days. After that, you can switch between ice and heat packs.  Avoid feeling anxious or stressed. Find good ways to deal with stress, such as exercise.  Maintain a healthy weight. Extra weight puts stress on your back. Contact a doctor if:  You have pain that does not go away with rest or medicine.  You have worsening pain that goes down into your legs or buttocks.  You have pain that does not get better in one week.  You have pain at night.  You lose weight.  You have a fever or chills. Get help right away if:  You cannot control when you poop (bowel movement) or pee (urinate).  Your arms or legs feel weak.  Your arms or legs lose feeling (numbness).  You feel sick to your stomach (nauseous) or throw up (vomit).  You have belly (abdominal) pain.  You feel like you may pass out (faint). This information is not intended to replace advice given to you by your health care provider.  Make sure you discuss any questions you have with your health care provider. Document Released: 06/23/2007 Document Revised: 06/12/2015 Document Reviewed: 05/08/2013 Elsevier Interactive Patient Education  2017 [employer]

## 2016-05-31 NOTE — Progress Notes (Signed)
Subjective:  [name], male    DOB: Dec 06, 1970  Age: 46 y.o. MRN: [id number]  CC: Establish Care   HPI Dave Jimenez presents for    Hypertension: Patient here for follow-up of elevated blood pressure. He is not exercising and is not adherent to low salt diet.  Blood pressure: He does not check BP at home.   Cardiac symptoms none. Patient denies none.  Cardiovascular risk factors: hypertension, male gender, sedentary lifestyle and smoking/ tobacco exposure. Use of agents associated with hypertension: NSAIDS. History of target organ damage: none  Back Pain: Patient presents for presents evaluation of low back problems.  Symptoms have been present for 2 months and include paresthesias of the RLE which is worse at night. Pain 5/10 and is shooting in quality. Initial inciting event: he reports history of fall which caused back symptoms. Alleviating factors identifiable by patient are sitting and standing. Exacerbating factors identifiable by patient are medication anti-inflammatories and muscle relaxants , recumbency and streching.  Previous lower back problems: none. Previous workup: xrays. Previous treatments: medications. He reports going back to work last Friday due to needing income. He reports needing to pay child support for his children as a factor.     Outpatient Medications Prior to Visit  Medication Sig Dispense Refill  . methocarbamol (ROBAXIN) 500 MG tablet Take 1 tablet (500 mg total) by mouth 3 (three) times daily. X 10 days then prn muscle spasm 90 tablet 0  . naproxen (NAPROSYN) 500 MG tablet Take 1 tablet twice daily with meals x 10 days, and then twice daily as needed for pain. (Patient not taking: Reported on 05/31/2016) 60 tablet 0   No facility-administered medications prior to visit.     ROS Review of Systems  Constitutional: Negative.   Eyes: Negative.   Respiratory: Negative.   Cardiovascular: Negative.   Gastrointestinal: Negative.     Musculoskeletal: Positive for back pain.  Skin: Negative.     Objective:  BP 135/80 (BP Location: Left Arm, Patient Position: Sitting, Cuff Size: Normal)   Pulse 63   Temp 98.2 F (36.8 C) (Oral)   Resp 18   Ht _0  (1.702 m)   Wt 180 lb 6.4 oz (81.8 kg)   SpO2 97%   BMI 28.25 kg/m   BP/Weight 06/03/2016 05/31/2016 0/25/4270  Systolic BP 623 762 831  Diastolic BP 87 80 94  Wt. (Lbs) 180 180.4 176.6  BMI 28.19 28.25 27.25     Physical Exam  Constitutional: He is oriented to person, place, and time. He appears well-developed and well-nourished.  Eyes: Conjunctivae are normal. Pupils are equal, round, and reactive to light.  Neck: No JVD present.  Cardiovascular: Normal rate, regular rhythm, normal heart sounds and intact distal pulses.   Pulmonary/Chest: Effort normal and breath sounds normal.  Abdominal: Soft. Bowel sounds are normal.  Musculoskeletal:       Lumbar back: He exhibits pain.  Neurological: He is alert and oriented to person, place, and time. He has normal reflexes.  Skin: Skin is warm and dry.  Nursing note and vitals reviewed.  Assessment & Plan:   Problem List Items Addressed This Visit    None    Visit Diagnoses    Essential hypertension    -  Primary   Relevant Medications   hydrochlorothiazide (HYDRODIURIL) 25 MG tablet   Other Relevant Orders   CMP14+EGFR   Lipid Panel   POCT UA - Microalbumin (Completed)   Lumbar sprain, sequela  Relevant Medications   naproxen (NAPROSYN) 500 MG tablet   Other Relevant Orders   Ambulatory referral to Orthopedics      Meds ordered this encounter  Medications  . naproxen (NAPROSYN) 500 MG tablet    Sig: Take 1 tablet twice daily with meals x 10 days, and then twice daily as needed for pain.    Dispense:  60 tablet    Refill:  0    Order Specific Question:   Supervising Provider    Answer:   Tresa Garter [id number]  . hydrochlorothiazide (HYDRODIURIL) 25 MG tablet    Sig: Take 0.5  tablets (12.5 mg total) by mouth daily.    Dispense:  30 tablet    Refill:  2    Order Specific Question:   Supervising Provider    Answer:   Tresa Garter [id number]    Follow-up: Return in about 2 weeks (around 06/14/2016) for Physical .   Alfonse Spruce FNP

## 2016-05-31 NOTE — Progress Notes (Signed)
Patient is here for low back pain  Patient is only taking ibuprofen 800 mg for pain

## 2016-06-02 ENCOUNTER — Other Ambulatory Visit: Payer: Managed Care, Other (non HMO)

## 2016-06-03 ENCOUNTER — Encounter (INDEPENDENT_AMBULATORY_CARE_PROVIDER_SITE_OTHER): Payer: Self-pay | Admitting: Specialist

## 2016-06-03 ENCOUNTER — Ambulatory Visit (INDEPENDENT_AMBULATORY_CARE_PROVIDER_SITE_OTHER): Payer: Managed Care, Other (non HMO)

## 2016-06-03 ENCOUNTER — Ambulatory Visit (INDEPENDENT_AMBULATORY_CARE_PROVIDER_SITE_OTHER): Payer: Managed Care, Other (non HMO) | Admitting: Specialist

## 2016-06-03 VITALS — BP 132/87 | HR 68 | Ht 67.0 in | Wt 180.0 lb

## 2016-06-03 DIAGNOSIS — M545 Low back pain: Secondary | ICD-10-CM

## 2016-06-03 MED ORDER — METHYLPREDNISOLONE 4 MG PO TABS: ORAL_TABLET | ORAL | 0 refills | Status: DC

## 2016-06-03 NOTE — Progress Notes (Signed)
Office Visit Note   Patient: Dave Jimenez           Date of Birth: 31-Jul-1970           MRN: [id number] Visit Date: 06/03/2016              Requested by: Lizbeth Bark, FNP [address] [place], [place] [address] PCP: Lizbeth Bark, FNP   Assessment & Plan: Visit Diagnoses:  1. Acute low back pain, unspecified back pain laterality, with sciatica presence unspecified     Plan: With patient's ongoing symptoms and failed conservative treatment up to this point and also question of possible L3 vertebral body fracture we will schedule lumbar spine MRI. follow-up in the office after completion to discuss results. Did give prescription for Medrol Dosepak 6 day taper to be taken as directed. Can continue muscle relaxer. Stop naproxen. All questions answered. Avoid excessive heavy lifting bending.   Follow-Up Instructions: Return in about 3 weeks (around 06/24/2016).   Orders:  Orders Placed This Encounter  Procedures  . XR Lumb Spine Flex&Ext Only  . MR Lumbar Spine w/o contrast   Meds ordered this encounter  Medications  . methylPREDNISolone (MEDROL) 4 MG tablet    Sig: 6 day taper to be taken as directed    Dispense:  21 tablet    Refill:  0      Procedures: No procedures performed   Clinical Data: No additional findings.   Subjective: Chief Complaint  Patient presents with  . Lower Back - Follow-up, Injury, Pain    DOI: 05/02/2016-Slipped on floor and fell    HPI Patient presents today with complaints of low back pain and right leg pain numbness and tingling. States that 05/02/2016 he was walking into a [employer] store when he slipped on a wet floor following backwards with a hard impact on his buttocks and landing also on his right side. Patient states that he had severe back pain after this incident. Stated that there was not a wet floor sign placed. Marked discomfort with movement and attempted ambulation. He asked the attendant for an  incident report but one was not done. EMS or law-enforcement was not called. Patient eventually was seen at the emergency room 05/04/2016 and diagnosed with musculoskeletal strain with sciatica. Imaging studies were not done at that visit. 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 He eventually went to his primary care physician 05/13/2016 and x-rays were ordered at that time. Report read moderate disc space narrowing and endplate hypertrophic changes L2-3, L3-4 and L4-5. No acute or subacute osseous abnormality. Patient has been prescribed muscle relaxers and naproxen without any improvement. Mr. having right-sided low back pain that radiates into his right thigh. Numbness and tingling down into the right foot. No symptoms on the left. He was seen in the emergency room 2015 and diagnosed with a lumbar strain after lifting a lawnmower. Has not had any issues with his back until 05/02/2016 injury. Review of Systems  Constitutional: Positive for activity change.  HENT: Negative.   Respiratory: Negative.   Cardiovascular: Negative.   Genitourinary: Negative.   Musculoskeletal: Positive for back pain and gait problem.  Neurological: Positive for numbness.  Psychiatric/Behavioral: Negative.      Objective: Vital Signs: BP 132/87 (BP Location: Left Arm, Patient Position: Sitting)   Pulse 68   Ht 5\' 7"  (1.702 m)   Wt 180 lb (81.6  kg)   BMI 28.19 kg/m   Physical Exam  Constitutional: He is oriented to person, place, and time. He appears well-developed and well-nourished.  HENT:  Head: Normocephalic and atraumatic.  Eyes: EOM are normal. Pupils are equal, round, and reactive to light.  Neck: Normal range of motion.  Pulmonary/Chest: Effort normal. No respiratory distress.  Abdominal: He exhibits no distension.  Musculoskeletal:  Gait is somewhat antalgic. Heel and toe gait causes pain in his right heel likely due to history of  plantar fasciitis. He has moderate discomfort with lumbar extension. Lumbar flexion with hands to knees with lumbar pain. Right-sided lumbar paraspinal spasms/tenderness. Negative on the left side. Tender over the L3-L5 spinous processes. Negative logroll bilateral hips. Has some right-sided low back and leg pain with right straight leg raise in the seated position. Negative on the left side. Bilateral calves are nontender. Neurovascularly intact. No focal motor deficits.  Neurological: He is alert and oriented to person, place, and time.  Skin: Skin is warm and dry.    Ortho Exam  Specialty Comments:  No specialty comments available.  Imaging: No results found.   PMFS History: There are no active problems to display for this patient.  No past medical history on file.  Family History  Problem Relation Age of Onset  . Hypertension Mother   . Heart disease Father     Past Surgical History:  Procedure Laterality Date  . 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 Smoking status: Current Every Day Smoker    Types: Cigars  . Smokeless tobacco: Never Used  . Alcohol use Yes     Comment: this past monday  . Drug use: Yes    Types: Marijuana     Comment: this past Monday  . Sexual activity: Not on file

## 2016-06-10 ENCOUNTER — Other Ambulatory Visit: Payer: Managed Care, Other (non HMO)

## 2016-06-17 ENCOUNTER — Other Ambulatory Visit: Payer: Managed Care, Other (non HMO)

## 2016-06-17 ENCOUNTER — Ambulatory Visit: Payer: Managed Care, Other (non HMO) | Attending: Family Medicine

## 2016-06-17 ENCOUNTER — Ambulatory Visit: Payer: Managed Care, Other (non HMO) | Admitting: Family Medicine

## 2016-06-17 DIAGNOSIS — I10 Essential (primary) hypertension: Secondary | ICD-10-CM | POA: Diagnosis present

## 2016-06-17 NOTE — Progress Notes (Signed)
Patient here for lab visit only 

## 2016-06-18 LAB — CMP14+EGFR
ALBUMIN: 4 g/dL (ref 3.5–5.5)
ALT: 49 IU/L — AB (ref 0–44)
AST: 33 IU/L (ref 0–40)
Albumin/Globulin Ratio: 1.9 (ref 1.2–2.2)
Alkaline Phosphatase: 45 IU/L (ref 39–117)
BILIRUBIN TOTAL: 0.3 mg/dL (ref 0.0–1.2)
BUN / CREAT RATIO: 7 — AB (ref 9–20)
BUN: 8 mg/dL (ref 6–24)
CHLORIDE: 104 mmol/L (ref 96–106)
CO2: 20 mmol/L (ref 18–29)
Calcium: 9.3 mg/dL (ref 8.7–10.2)
Creatinine, Ser: 1.16 mg/dL (ref 0.76–1.27)
GFR calc non Af Amer: 75 mL/min/{1.73_m2} (ref >59)
GFR, EST AFRICAN AMERICAN: 86 mL/min/{1.73_m2} (ref >59)
GLUCOSE: 80 mg/dL (ref 65–99)
Globulin, Total: 2.1 g/dL (ref 1.5–4.5)
Potassium: 4.3 mmol/L (ref 3.5–5.2)
Sodium: 141 mmol/L (ref 134–144)
TOTAL PROTEIN: 6.1 g/dL (ref 6.0–8.5)

## 2016-06-18 LAB — LIPID PANEL
CHOL/HDL RATIO: 5.2 ratio — AB (ref 0.0–5.0)
Cholesterol, Total: 215 mg/dL — ABNORMAL HIGH (ref 100–199)
HDL: 41 mg/dL (ref >39)
LDL Calculated: 138 mg/dL — ABNORMAL HIGH (ref 0–99)
Triglycerides: 179 mg/dL — ABNORMAL HIGH (ref 0–149)
VLDL CHOLESTEROL CAL: 36 mg/dL (ref 5–40)

## 2016-06-23 ENCOUNTER — Other Ambulatory Visit: Payer: Self-pay | Admitting: Family Medicine

## 2016-06-23 DIAGNOSIS — E782 Mixed hyperlipidemia: Secondary | ICD-10-CM

## 2016-06-23 MED ORDER — PRAVASTATIN SODIUM 20 MG PO TABS: 20.0000 mg | ORAL_TABLET | Freq: Every day | ORAL | 2 refills | Status: DC

## 2016-06-25 ENCOUNTER — Telehealth: Payer: Self-pay

## 2016-06-25 NOTE — Telephone Encounter (Signed)
-----   Message from Lizbeth BarkMandesia R Hairston, FNP sent at 06/23/2016  1:43 PM EDT ----- Kidney function normal Liver function normal Lipid levels were elevated. This can increase your risk of heart disease overtime. You will be prescribed pravastatin to help lower risk. Start eating a diet low in saturated fat. Limit your intake of fried foods, red meats, and whole milk. Increase activity.  Recommend follow up in 3 months.

## 2016-06-25 NOTE — Telephone Encounter (Signed)
CMA call regarding lab results   Patient did not answer but left a VM stating the reason of the call &  to call me back  

## 2016-06-26 ENCOUNTER — Ambulatory Visit
Admission: RE | Admit: 2016-06-26 | Discharge: 2016-06-26 | Disposition: A | Discharge status: Home / Self Care | Payer: Managed Care, Other (non HMO) | Source: Ambulatory Visit | Attending: Surgery | Admitting: Surgery

## 2016-06-26 DIAGNOSIS — M545 Low back pain: Secondary | ICD-10-CM

## 2016-07-08 ENCOUNTER — Encounter (INDEPENDENT_AMBULATORY_CARE_PROVIDER_SITE_OTHER): Payer: Self-pay | Admitting: Specialist

## 2016-07-08 ENCOUNTER — Ambulatory Visit: Payer: Managed Care, Other (non HMO) | Attending: Family Medicine | Admitting: Family Medicine

## 2016-07-08 ENCOUNTER — Encounter: Payer: Self-pay | Admitting: Family Medicine

## 2016-07-08 ENCOUNTER — Other Ambulatory Visit (HOSPITAL_COMMUNITY)
Admission: RE | Admit: 2016-07-08 | Discharge: 2016-07-08 | Disposition: A | Discharge status: Home / Self Care | Payer: Managed Care, Other (non HMO) | Source: Ambulatory Visit | Attending: Family Medicine | Admitting: Family Medicine

## 2016-07-08 ENCOUNTER — Ambulatory Visit (INDEPENDENT_AMBULATORY_CARE_PROVIDER_SITE_OTHER): Payer: Managed Care, Other (non HMO) | Admitting: Specialist

## 2016-07-08 ENCOUNTER — Ambulatory Visit: Payer: Managed Care, Other (non HMO) | Attending: Family Medicine | Admitting: Licensed Clinical Social Worker

## 2016-07-08 VITALS — BP 120/78 | HR 65 | Temp 98.0°F | Resp 18 | Ht 67.0 in | Wt 175.8 lb

## 2016-07-08 VITALS — BP 124/82 | HR 78 | Ht 67.5 in | Wt 176.0 lb

## 2016-07-08 DIAGNOSIS — Z113 Encounter for screening for infections with a predominantly sexual mode of transmission: Secondary | ICD-10-CM | POA: Diagnosis not present

## 2016-07-08 DIAGNOSIS — S335XXA Sprain of ligaments of lumbar spine, initial encounter: Secondary | ICD-10-CM

## 2016-07-08 DIAGNOSIS — I10 Essential (primary) hypertension: Secondary | ICD-10-CM | POA: Diagnosis not present

## 2016-07-08 DIAGNOSIS — F4322 Adjustment disorder with anxiety: Secondary | ICD-10-CM

## 2016-07-08 DIAGNOSIS — E782 Mixed hyperlipidemia: Secondary | ICD-10-CM | POA: Insufficient documentation

## 2016-07-08 DIAGNOSIS — Z0000 Encounter for general adult medical examination without abnormal findings: Secondary | ICD-10-CM | POA: Diagnosis not present

## 2016-07-08 DIAGNOSIS — Z8249 Family history of ischemic heart disease and other diseases of the circulatory system: Secondary | ICD-10-CM | POA: Diagnosis not present

## 2016-07-08 DIAGNOSIS — N289 Disorder of kidney and ureter, unspecified: Secondary | ICD-10-CM

## 2016-07-08 DIAGNOSIS — R12 Heartburn: Secondary | ICD-10-CM | POA: Diagnosis not present

## 2016-07-08 DIAGNOSIS — M545 Low back pain: Secondary | ICD-10-CM | POA: Diagnosis not present

## 2016-07-08 DIAGNOSIS — M5440 Lumbago with sciatica, unspecified side: Secondary | ICD-10-CM | POA: Diagnosis not present

## 2016-07-08 DIAGNOSIS — F17290 Nicotine dependence, other tobacco product, uncomplicated: Secondary | ICD-10-CM | POA: Insufficient documentation

## 2016-07-08 DIAGNOSIS — Z79899 Other long term (current) drug therapy: Secondary | ICD-10-CM | POA: Insufficient documentation

## 2016-07-08 MED ORDER — NAPROXEN SODIUM ER 500 MG PO TB24: 500.0000 mg | ORAL_TABLET | Freq: Every day | ORAL | 2 refills | Status: DC

## 2016-07-08 MED ORDER — PRAVASTATIN SODIUM 20 MG PO TABS: 20.0000 mg | ORAL_TABLET | Freq: Every day | ORAL | 2 refills | Status: DC

## 2016-07-08 MED ORDER — METHOCARBAMOL 500 MG PO TABS: 500.0000 mg | ORAL_TABLET | Freq: Three times a day (TID) | ORAL | 0 refills | Status: DC

## 2016-07-08 MED ORDER — RANITIDINE HCL 150 MG PO TABS: 150.0000 mg | ORAL_TABLET | Freq: Two times a day (BID) | ORAL | Status: DC | PRN

## 2016-07-08 NOTE — Addendum Note (Signed)
Addended by: Vira BrownsNITKA, Tamaya Pun on: 07/08/2016 06:43 PM   Modules accepted: Orders

## 2016-07-08 NOTE — Progress Notes (Addendum)
Office Visit Note   Patient: Dave Jimenez           Date of Birth: 1970-12-03           MRN: [id number] Visit Date: 07/08/2016              Requested by: Lizbeth Bark, FNP [address] [place], [place] [address] PCP: Lizbeth Bark, FNP   Assessment & Plan: Visit Diagnoses:  1. Acute bilateral low back pain with sciatica, sciatica laterality unspecified   2. Lumbar sprain, initial encounter   3. Renal lesion   46 year old male with back pain and leg discomfort following a fall in 04/2016, has been seen at the [facility] ER twice with persisting back and  Left greater than right leg pain. MRI shows disc changes left posterolateral L3-4 and L4-5 which may represent disc injury, clinically unable to discern a focal neurologic deficit. I recommend conservative treatment, NSAIDs, reduced stress over the lumbar discs. The MRI also shows an incidental complex right renal mass and the radiologist recommends a further assessment of this area with a dedicated abdomen MRI with renal mass protocol. Will use naprelan to treat back pain and continue with methocarbomal, start physical therapy. Presently neurotension signs are negative. Return appointment in 6 weeks for follow up. Will call with the results of the abdomenal MRI.  Plan:Avoid frequent bending and stooping  No lifting greater than 10-15 lbs. May use ice or moist heat for pain. Weight loss is of benefit. Recommend entering into a physical therapy program. MRI of the abdomen renal lesion protocol as recommended by radiology right renal complex mass. Follow-Up Instructions: Return in about 6 weeks (around 08/19/2016).   Orders:  Orders Placed This Encounter  Procedures  . MR ABDOMEN WO CONTRAST  . Ambulatory referral to Physical Therapy   Meds ordered this encounter  Medications  . naproxen (NAPRELAN) 500 MG 24 hr tablet    Sig: Take 1 tablet (500 mg total) by mouth daily with breakfast.    Dispense:  50 tablet    Refill:   2  . methocarbamol (ROBAXIN) 500 MG tablet    Sig: Take 1 tablet (500 mg total) by mouth 3 (three) times daily. X 10 days then prn muscle spasm    Dispense:  90 tablet    Refill:  0      Procedures: No procedures performed   Clinical Data: Findings:  MRI of the lumbar spine reviewed with patient, shows a complex renal lesion posteior inferior right kidney, radiologist recommends a follow up study to further evaluate. Mild disc dessication multiple levels with left L3-4 and L4-5 posterolateral disc protrusion versus disc tear. Mild narrowing of the left L3-4 and L4-5 neuroforamen.     Subjective: Chief Complaint  Patient presents with  . Lower Back - Follow-up    MRI Review---Lumbar spine    HPI  Review of Systems   Objective: Vital Signs: BP 124/82 (BP Location: Left Arm, Patient Position: Sitting)   Pulse 78   Ht 5' 7.5" (1.715 m)   Wt 176 lb (79.8 kg)   BMI 27.16 kg/m   Physical Exam  Ortho Exam  Specialty Comments:  No specialty comments available.  Imaging: No results found.   PMFS History: There are no active problems to display for this patient.  No past medical history on file.  Family History  Problem Relation Age of Onset  . Hypertension Mother   . Heart disease Father  Past Surgical History:  Procedure Laterality Date  . 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 Smoking status: Current Every Day Smoker    Types: Cigars  . Smokeless tobacco: Never Used  . Alcohol use Yes     Comment: this past monday  . Drug use: Yes    Types: Marijuana     Comment: this past Monday  . Sexual activity: Not on file

## 2016-07-08 NOTE — Patient Instructions (Signed)
Heart-Healthy Eating Plan Heart-healthy meal planning includes:  Limiting unhealthy fats.  Increasing healthy fats.  Making other small dietary changes.  You may need to talk with your doctor or a diet specialist (dietitian) to create an eating plan that is right for you. What types of fat should I choose?  Choose healthy fats. These include olive oil and canola oil, flaxseeds, walnuts, almonds, and seeds.  Eat more omega-3 fats. These include salmon, mackerel, sardines, tuna, flaxseed oil, and ground flaxseeds. Try to eat fish at least twice each week.  Limit saturated fats. ? Saturated fats are often found in animal products, such as meats, butter, and cream. ? Plant sources of saturated fats include palm oil, palm kernel oil, and coconut oil.  Avoid foods with partially hydrogenated oils in them. These include stick margarine, some tub margarines, cookies, crackers, and other baked goods. These contain trans fats. What general guidelines do I need to follow?  Check food labels carefully. Identify foods with trans fats or high amounts of saturated fat.  Fill one half of your plate with vegetables and green salads. Eat 4-5 servings of vegetables per day. A serving of vegetables is: ? 1 cup of raw leafy vegetables. ?  cup of raw or cooked cut-up vegetables. ?  cup of vegetable juice.  Fill one fourth of your plate with whole grains. Look for the word "whole" as the first word in the ingredient list.  Fill one fourth of your plate with lean protein foods.  Eat 4-5 servings of fruit per day. A serving of fruit is: ? One medium whole fruit. ?  cup of dried fruit. ?  cup of fresh, frozen, or canned fruit. ?  cup of 100% fruit juice.  Eat more foods that contain soluble fiber. These include apples, broccoli, carrots, beans, peas, and barley. Try to get 20-30 g of fiber per day.  Eat more home-cooked food. Eat less restaurant, buffet, and fast food.  Limit or avoid  alcohol.  Limit foods high in starch and sugar.  Avoid fried foods.  Avoid frying your food. Try baking, boiling, grilling, or broiling it instead. You can also reduce fat by: ? Removing the skin from poultry. ? Removing all visible fats from meats. ? Skimming the fat off of stews, soups, and gravies before serving them. ? Steaming vegetables in water or broth.  Lose weight if you are overweight.  Eat 4-5 servings of nuts, legumes, and seeds per week: ? One serving of dried beans or legumes equals  cup after being cooked. ? One serving of nuts equals 1 ounces. ? One serving of seeds equals  ounce or one tablespoon.  You may need to keep track of how much salt or sodium you eat. This is especially true if you have high blood pressure. Talk with your doctor or dietitian to get more information. What foods can I eat? Grains Breads, including French, white, pita, wheat, raisin, rye, oatmeal, and Italian. Tortillas that are neither fried nor made with lard or trans fat. Low-fat rolls, including hotdog and hamburger buns and English muffins. Biscuits. Muffins. Waffles. Pancakes. Light popcorn. Whole-grain cereals. Flatbread. Melba toast. Pretzels. Breadsticks. Rusks. Low-fat snacks. Low-fat crackers, including oyster, saltine, matzo, graham, animal, and rye. Rice and pasta, including brown rice and pastas that are made with whole wheat. Vegetables All vegetables. Fruits All fruits, but limit coconut. Meats and Other Protein Sources Lean, well-trimmed beef, veal, pork, and lamb. Chicken and turkey without skin. All fish and shellfish.   Wild duck, rabbit, pheasant, and venison. Egg whites or low-cholesterol egg substitutes. Dried beans, peas, lentils, and tofu. Seeds and most nuts. Dairy Low-fat or nonfat cheeses, including ricotta, string, and mozzarella. Skim or 1% milk that is liquid, powdered, or evaporated. Buttermilk that is made with low-fat milk. Nonfat or low-fat  yogurt. Beverages Mineral water. Diet carbonated beverages. Sweets and Desserts Sherbets and fruit ices. Honey, jam, marmalade, jelly, and syrups. Meringues and gelatins. Pure sugar candy, such as hard candy, jelly beans, gumdrops, mints, marshmallows, and small amounts of dark chocolate. [employer]. Eat all sweets and desserts in moderation. Fats and Oils Nonhydrogenated (trans-free) margarines. Vegetable oils, including soybean, sesame, sunflower, olive, peanut, safflower, corn, canola, and cottonseed. Salad dressings or mayonnaise made with a vegetable oil. Limit added fats and oils that you use for cooking, baking, salads, and as spreads. Other Cocoa powder. Coffee and tea. All seasonings and condiments. The items listed above may not be a complete list of recommended foods or beverages. Contact your dietitian for more options. What foods are not recommended? Grains Breads that are made with saturated or trans fats, oils, or whole milk. Croissants. Butter rolls. Cheese breads. Sweet rolls. Donuts. Buttered popcorn. Chow mein noodles. High-fat crackers, such as cheese or butter crackers. Meats and Other Protein Sources Fatty meats, such as hotdogs, short ribs, sausage, spareribs, bacon, rib eye roast or steak, and mutton. High-fat deli meats, such as salami and bologna. Caviar. Domestic duck and goose. Organ meats, such as kidney, liver, sweetbreads, and heart. Dairy Cream, sour cream, cream cheese, and creamed cottage cheese. Whole-milk cheeses, including blue (bleu), [address], [place], [place], [address], [place], [address], cheddar, [place], and [place]. Whole or 2% milk that is liquid, evaporated, or condensed. Whole buttermilk. Cream sauce or high-fat cheese sauce. Yogurt that is made from whole milk. Beverages Regular sodas and juice drinks with added sugar. Sweets and Desserts Frosting. Pudding. Cookies. Cakes other than angel food cake. Candy that has milk chocolate or white  chocolate, hydrogenated fat, butter, coconut, or unknown ingredients. Buttered syrups. Full-fat ice cream or ice cream drinks. Fats and Oils Gravy that has suet, meat fat, or shortening. Cocoa butter, hydrogenated oils, palm oil, coconut oil, palm kernel oil. These can often be found in baked products, candy, fried foods, nondairy creamers, and whipped toppings. Solid fats and shortenings, including bacon fat, salt pork, lard, and butter. Nondairy cream substitutes, such as coffee creamers and sour cream substitutes. Salad dressings that are made of unknown oils, cheese, or sour cream. The items listed above may not be a complete list of foods and beverages to avoid. Contact your dietitian for more information. This information is not intended to replace advice given to you by your health care provider. Make sure you discuss any questions you have with your health care provider. Document Released: 07/06/2011 Document Revised: 06/12/2015 Document Reviewed: 06/28/2013 Elsevier Interactive Patient Education  2018 [employer].   Food Choices for Gastroesophageal Reflux Disease, Adult When you have gastroesophageal reflux disease (GERD), the foods you eat and your eating habits are very important. Choosing the right foods can help ease your discomfort. What guidelines do I need to follow?  Choose fruits, vegetables, whole grains, and low-fat dairy products.  Choose low-fat meat, fish, and poultry.  Limit fats such as oils, salad dressings, butter, nuts, and avocado.  Keep a food diary. This helps you identify foods that cause symptoms.  Avoid foods that cause symptoms. These may be different for everyone.  Eat small  meals often instead of 3 large meals a day.  Eat your meals slowly, in a place where you are relaxed.  Limit fried foods.  Cook foods using methods other than frying.  Avoid drinking alcohol.  Avoid drinking large amounts of liquids with your meals.  Avoid bending over or  lying down until 2-3 hours after eating. What foods are not recommended? These are some foods and drinks that may make your symptoms worse: Vegetables Tomatoes. Tomato juice. Tomato and spaghetti sauce. Chili peppers. Onion and garlic. Horseradish. Fruits Oranges, grapefruit, and lemon (fruit and juice). Meats High-fat meats, fish, and poultry. This includes hot dogs, ribs, ham, sausage, salami, and bacon. Dairy Whole milk and chocolate milk. Sour cream. Cream. Butter. Ice cream. Cream cheese. Drinks Coffee and tea. Bubbly (carbonated) drinks or energy drinks. Condiments Hot sauce. Barbecue sauce. Sweets/Desserts Chocolate and cocoa. Donuts. Peppermint and spearmint. Fats and Oils High-fat foods. This includes [place] fries and potato chips. Other Vinegar. Strong spices. This includes black pepper, white pepper, red pepper, cayenne, curry powder, cloves, ginger, and chili powder. The items listed above may not be a complete list of foods and drinks to avoid. Contact your dietitian for more information. This information is not intended to replace advice given to you by your health care provider. Make sure you discuss any questions you have with your health care provider. Document Released: 07/06/2011 Document Revised: 06/12/2015 Document Reviewed: 11/08/2012 Elsevier Interactive Patient Education  2017 [employer]

## 2016-07-08 NOTE — Progress Notes (Signed)
Subjective:   [name], male    DOB: 1970/03/10, 46 y.o.   MRN: [id number]  Chief Complaint  Patient presents with  . Annual Exam   HPI Dave Jimenez 46 y.o. male presents for annual physical examination. He denies any CP, dyspnea, syncope, or swelling of the bilateral extremities. History of lower back pain. Reports pain is managed with current interventions which include NSAID's, muscle relaxants, and following up with orthopedic specialist. He does reports occasional heartburn. He denies any melena, hematochezia, abdominal pain, or N/V. History of hypertension and hyperlipidemia. He reports adherence with anti-hypertensives. He reports not picking up his statin medication yet. STI testing offered. He is agreeable to STI testing at this time. He reports 2 sexual partners within the last 3 months, sexual intercourse was protected and unprotected. He does reports anxiety. Onset of symptoms was approximately several months ago, unchanged since that time. He denies current suicidal and homicidal ideation.Possible organic causes contributing are: none. Risk factors: negative life event : recent history of back injury and financial concerns. Previous treatment includes none.    No past medical history on file.   Family History  Problem Relation Age of Onset  . Hypertension Mother   . Heart disease Father     Social History   Social History  . Marital status: Single    Spouse name: N/A  . Number of children: N/A  . Years of education: N/A   Occupational History  .  [employer]    CNA   Social History Main Topics  . Smoking status: Current Every Day Smoker    Types: Cigars  . Smokeless tobacco: Never Used  . Alcohol use Yes     Comment: this past monday  . Drug use: Yes    Types: Marijuana     Comment: this past Monday  . Sexual activity: Not on file   Outpatient Medications Prior to Visit  Medication Sig Dispense Refill  . hydrochlorothiazide  (HYDRODIURIL) 25 MG tablet Take 0.5 tablets (12.5 mg total) by mouth daily. 30 tablet 2  . methylPREDNISolone (MEDROL) 4 MG tablet 6 day taper to be taken as directed 21 tablet 0  . naproxen (NAPROSYN) 500 MG tablet Take 1 tablet twice daily with meals x 10 days, and then twice daily as needed for pain. 60 tablet 0  . methocarbamol (ROBAXIN) 500 MG tablet Take 1 tablet (500 mg total) by mouth 3 (three) times daily. X 10 days then prn muscle spasm 90 tablet 0  . pravastatin (PRAVACHOL) 20 MG tablet Take 1 tablet (20 mg total) by mouth daily. 30 tablet 2   No facility-administered medications prior to visit.     No Known Allergies  Review of Systems  Constitutional: Negative.   HENT: Negative.   Eyes: Negative.   Respiratory: Negative.   Cardiovascular: Negative.   Gastrointestinal: Positive for heartburn.  Genitourinary: Negative.   Musculoskeletal: Positive for back pain.  Skin: Negative.   Neurological: Negative.   Psychiatric/Behavioral: The patient is nervous/anxious.       Objective:    Physical Exam  Constitutional: He is oriented to person, place, and time. He appears well-developed and well-nourished.  HENT:  Head: Normocephalic and atraumatic.  Right Ear: External ear normal.  Left Ear: External ear normal.  Nose: Nose normal.  Mouth/Throat: Oropharynx is clear and moist.  Eyes: Conjunctivae and EOM are normal. Pupils are equal, round, and reactive to light.  Neck: Normal range of motion. Neck supple.  Cardiovascular: Normal rate, regular rhythm, normal heart sounds and intact distal pulses.   Pulmonary/Chest: Effort normal and breath sounds normal.  Abdominal: Soft. Bowel sounds are normal. There is no tenderness.  Musculoskeletal: Normal range of motion.       Lumbar back: He exhibits pain and spasm.  Neurological: He is alert and oriented to person, place, and time. He has normal reflexes.  Skin: Skin is warm and dry.  Psychiatric: His mood appears anxious. He  expresses no homicidal and no suicidal ideation. He expresses no suicidal plans and no homicidal plans.  Nursing note and vitals reviewed.   BP 120/78 (BP Location: Left Arm, Patient Position: Sitting, Cuff Size: Normal)   Pulse 65   Temp 98 F (36.7 C) (Oral)   Resp 18   Ht '5\' 7"'  (1.702 m)   Wt 175 lb 12.8 oz (79.7 kg)   SpO2 99%   BMI 27.53 kg/m  Wt Readings from Last 3 Encounters:  07/08/16 176 lb (79.8 kg)  07/08/16 175 lb 12.8 oz (79.7 kg)  06/03/16 180 lb (81.6 kg)    Lab Results  Component Value Date   TSH 1.350 07/08/2016   Lab Results  Component Value Date   WBC 8.7 07/08/2016   HGB 13.9 07/08/2016   HCT 41.5 07/08/2016   MCV 91 07/08/2016   PLT 263 07/08/2016   Lab Results  Component Value Date   NA 143 07/08/2016   K 4.2 07/08/2016   CO2 23 07/08/2016   GLUCOSE 83 07/08/2016   BUN 10 07/08/2016   CREATININE 1.14 07/08/2016   BILITOT 0.3 07/08/2016   ALKPHOS 46 07/08/2016   AST 25 07/08/2016   ALT 37 07/08/2016   PROT 6.2 07/08/2016   ALBUMIN 4.1 07/08/2016   CALCIUM 9.3 07/08/2016   Lab Results  Component Value Date   CHOL 215 (H) 06/17/2016   Lab Results  Component Value Date   HDL 41 06/17/2016   Lab Results  Component Value Date   LDLCALC 138 (H) 06/17/2016   Lab Results  Component Value Date   TRIG 179 (H) 06/17/2016   Lab Results  Component Value Date   CHOLHDL 5.2 (H) 06/17/2016   Lab Results  Component Value Date   HGBA1C 5.3 07/08/2016       Assessment & Plan:   Problem List Items Addressed This Visit    None    Visit Diagnoses    Annual physical exam    -  Primary   Relevant Orders   TSH (Completed)   Hemoglobin A1c (Completed)   CMP14+EGFR (Completed)   Vitamin D, 25-hydroxy (Completed)   CBC with Differential (Completed)   Adjustment disorder with anxious mood       LCSW spoke with patient and provided resources.   Heartburn       Relevant Medications   ranitidine (ZANTAC) 150 MG tablet   Essential  hypertension       Relevant Medications   pravastatin (PRAVACHOL) 20 MG tablet   Mixed hyperlipidemia       Relevant Medications   pravastatin (PRAVACHOL) 20 MG tablet   Screening for STDs (sexually transmitted diseases)       Relevant Orders   Urine cytology ancillary only (Completed)   HEP, RPR, HIV Panel (Completed)   HSV(herpes simplex vrs) 1+2 ab-IgG (Completed)       Meds ordered this encounter  Medications  . ranitidine (ZANTAC) 150 MG tablet    Sig: Take 1 tablet (150 mg total) by  mouth 2 (two) times daily as needed for heartburn.    Order Specific Question:   Supervising Provider    Answer:   Tresa Garter [id number]  . pravastatin (PRAVACHOL) 20 MG tablet    Sig: Take 1 tablet (20 mg total) by mouth daily.    Dispense:  30 tablet    Refill:  2    Order Specific Question:   Supervising Provider    Answer:   Tresa Garter [id number]   Follow up: Return in about 3 months (around 10/08/2016) for HTN.   Fredia Beets, FNP

## 2016-07-08 NOTE — Patient Instructions (Signed)
Avoid frequent bending and stooping  No lifting greater than 10-15 lbs. May use ice or moist heat for pain. Weight loss is of benefit. Recommend entering into a physical therapy program.

## 2016-07-08 NOTE — Progress Notes (Signed)
Patient is here for physical.

## 2016-07-09 ENCOUNTER — Telehealth (INDEPENDENT_AMBULATORY_CARE_PROVIDER_SITE_OTHER): Payer: Self-pay

## 2016-07-09 ENCOUNTER — Other Ambulatory Visit (INDEPENDENT_AMBULATORY_CARE_PROVIDER_SITE_OTHER): Payer: Self-pay

## 2016-07-09 LAB — CBC WITH DIFFERENTIAL/PLATELET
Basophils Absolute: 0 10*3/uL (ref 0.0–0.2)
Basos: 0 %
EOS (ABSOLUTE): 0.1 10*3/uL (ref 0.0–0.4)
Eos: 1 %
Hematocrit: 41.5 % (ref 37.5–51.0)
Hemoglobin: 13.9 g/dL (ref 13.0–17.7)
Immature Grans (Abs): 0 10*3/uL (ref 0.0–0.1)
Immature Granulocytes: 0 %
Lymphocytes Absolute: 2.7 10*3/uL (ref 0.7–3.1)
Lymphs: 31 %
MCH: 30.5 pg (ref 26.6–33.0)
MCHC: 33.5 g/dL (ref 31.5–35.7)
MCV: 91 fL (ref 79–97)
Monocytes Absolute: 1 10*3/uL — ABNORMAL HIGH (ref 0.1–0.9)
Monocytes: 12 %
NEUTROS ABS: 4.9 10*3/uL (ref 1.4–7.0)
Neutrophils: 56 %
Platelets: 263 10*3/uL (ref 150–379)
RBC: 4.56 x10E6/uL (ref 4.14–5.80)
RDW: 13.8 % (ref 12.3–15.4)
WBC: 8.7 10*3/uL (ref 3.4–10.8)

## 2016-07-09 LAB — CMP14+EGFR
ALBUMIN: 4.1 g/dL (ref 3.5–5.5)
ALK PHOS: 46 IU/L (ref 39–117)
ALT: 37 IU/L (ref 0–44)
AST: 25 IU/L (ref 0–40)
Albumin/Globulin Ratio: 2 (ref 1.2–2.2)
BILIRUBIN TOTAL: 0.3 mg/dL (ref 0.0–1.2)
BUN / CREAT RATIO: 9 (ref 9–20)
BUN: 10 mg/dL (ref 6–24)
CO2: 23 mmol/L (ref 20–29)
Calcium: 9.3 mg/dL (ref 8.7–10.2)
Chloride: 106 mmol/L (ref 96–106)
Creatinine, Ser: 1.14 mg/dL (ref 0.76–1.27)
GFR calc Af Amer: 88 mL/min/{1.73_m2} (ref >59)
GFR calc non Af Amer: 76 mL/min/{1.73_m2} (ref >59)
GLOBULIN, TOTAL: 2.1 g/dL (ref 1.5–4.5)
Glucose: 83 mg/dL (ref 65–99)
Potassium: 4.2 mmol/L (ref 3.5–5.2)
SODIUM: 143 mmol/L (ref 134–144)
Total Protein: 6.2 g/dL (ref 6.0–8.5)

## 2016-07-09 LAB — HEMOGLOBIN A1C
Est. average glucose Bld gHb Est-mCnc: 105 mg/dL
HEMOGLOBIN A1C: 5.3 % (ref 4.8–5.6)

## 2016-07-09 LAB — URINE CYTOLOGY ANCILLARY ONLY
Chlamydia: NEGATIVE
Neisseria Gonorrhea: NEGATIVE
Trichomonas: NEGATIVE

## 2016-07-09 LAB — TSH: TSH: 1.35 u[IU]/mL (ref 0.450–4.500)

## 2016-07-09 LAB — VITAMIN D 25 HYDROXY (VIT D DEFICIENCY, FRACTURES): VIT D 25 HYDROXY: 14.6 ng/mL — AB (ref 30.0–100.0)

## 2016-07-09 LAB — HSV(HERPES SIMPLEX VRS) I + II AB-IGG
HSV 1 Glycoprotein G Ab, IgG: 50.6 index — ABNORMAL HIGH (ref 0.00–0.90)
HSV 2 GLYCOPROTEIN G AB, IGG: 13 {index} — AB (ref 0.00–0.90)

## 2016-07-09 LAB — HEP, RPR, HIV PANEL
HIV Screen 4th Generation wRfx: NONREACTIVE
Hepatitis B Surface Ag: NEGATIVE
RPR: NONREACTIVE

## 2016-07-09 NOTE — BH Specialist Note (Signed)
Integrated Behavioral Health Initial Visit  MRN: [id number] Name: Dave Jimenez   Session Start time: 10:20 AM Session End time: 11:00 AM Total time: 40 minutes  Type of Service: Integrated Behavioral Health- Individual/Family Interpretor:No. Interpretor Name and Language: N/A   Warm Hand Off Completed.       SUBJECTIVE: Dave Jimenez is a 46 y.o. male accompanied by patient. Patient was referred by FNP Hairston for anxiety. Patient reports the following symptoms/concerns: feelings of worry, difficulty relaxing, irritability, and racing thoughts Duration of problem: Two months; Severity of problem: moderate  OBJECTIVE: Mood: Anxious and Affect: Appropriate Risk of harm to self or others: No plan to harm self or others   LIFE CONTEXT: Family and Social: Pt receives strong support from family and friends. He has two children (47 year old twins) and has an amiable relationship with their mother School/Work: Pt is employed as a Arts development officerCNA full time Self-Care: Pt smokes marijuana to cope with chronic pain and stress Life Changes: Pt was injured after falling on a wet floor from [employer]. He is experiencing financial strain from medical bills  GOALS ADDRESSED: Patient will reduce symptoms of: anxiety and increase knowledge and/or ability of: coping skills and also: Increase adequate support systems for patient/family   INTERVENTIONS: Solution-Focused Strategies, Supportive Counseling, Psychoeducation and/or Health Education and Link to [facility]  Standardized Assessments completed: PHQ 2&9  ASSESSMENT: Patient currently experiencing anxiety triggered by chronic pain and financial strain from medical bills. He reports feelings of worry, difficulty relaxing, irritability, and racing thoughts. Patient may benefit from psychoeduation, psychotherapy, and medication management. LCSWA educated pt on the correlation between physical and mental health, in addition, to how stress can  negatively impact one's health. LCSWA discussed benefits of applying healthy coping skills to decrease symptoms. Pt successfully identified healthy strategies to utilize on a daily basis. He plans to initiate counseling through [employer]. LCSWA provided community resources for crisis intervention, psychotherapy, and medication management.    PLAN: 1. Follow up with behavioral health clinician on : Pt was encouraged to contact LCSWA if symptoms worsen or fail to improve to [facility]. 2. Behavioral recommendations: LCSWA recommends that pt apply healthy coping skills discussed, initiate counseling, and utilize provided resources. Pt is encouraged to schedule follow up appointment with LCSWA 3. Referral(s): Community Mental Health Services (LME/Outside Clinic) 4. "From scale of 1-10, how likely are you to follow plan?": 9/10  Bridgett LarssonJasmine D Ivorie Uplinger, LCSW 07/09/16 4:28 PM

## 2016-07-09 NOTE — Telephone Encounter (Signed)
Received call from [facility] stating that patients order for his scan is wrong. It should be ordered as an abdomen with and without contrast. She asked if this could be changed so scan could be scheduled. Please contact [facility] once this is done. Thanks.  

## 2016-07-12 NOTE — Telephone Encounter (Signed)
[facility] stating that patients order for his scan is wrong. It should be ordered as an abdomen with and without contrast. She asked if this could be changed so scan could be scheduled. Please contact [facility] once this is done. Thanks.

## 2016-07-14 ENCOUNTER — Other Ambulatory Visit (INDEPENDENT_AMBULATORY_CARE_PROVIDER_SITE_OTHER): Payer: Self-pay | Admitting: Specialist

## 2016-07-14 DIAGNOSIS — N289 Disorder of kidney and ureter, unspecified: Secondary | ICD-10-CM

## 2016-07-14 NOTE — Telephone Encounter (Signed)
Order placed for abdomen MRI with and without contrast. The note on this patients lumbar MRI report from radiologist Wasn't quite clear about the type of study just said renal protocol. jen

## 2016-07-15 ENCOUNTER — Other Ambulatory Visit: Payer: Self-pay | Admitting: Family Medicine

## 2016-07-15 DIAGNOSIS — E559 Vitamin D deficiency, unspecified: Secondary | ICD-10-CM

## 2016-07-15 MED ORDER — VITAMIN D (ERGOCALCIFEROL) 1.25 MG (50000 UNIT) PO CAPS: 50000.0000 [IU] | ORAL_CAPSULE | ORAL | 0 refills | Status: AC

## 2016-07-15 NOTE — Telephone Encounter (Signed)
He corrected the order

## 2016-07-16 ENCOUNTER — Telehealth: Payer: Self-pay

## 2016-07-16 NOTE — Telephone Encounter (Signed)
-----   Message from Lizbeth BarkMandesia R Hairston, FNP sent at 07/15/2016  8:18 AM EDT ----- HIV, Hepatitis B, and syphilis are all negative. Gonorrhea, Chlamydia,, and Trichomonas were all negative. Herpes type 1 which is primarily responsible for cold sores is positive. When you have cold sores do not kiss anyone, share utensils, or have oral sex. Herpes type 2 which is primarily responsible for genital herpes is positive. This indicates you have been exposed to the herpes virus in the past. Herpes cannot be cured. To decrease the risk of spreading of herpes use a condom every time you have sex, do not have sex when you have symptoms of an outbreak (blisters, sores, tingling). Medications are available to help manage outbreaks.  Liver function normal Kidney function normal Thyroid function normal Labs that evaluated your blood cells, fluid and electrolyte balance are normal. No signs of anemia, infection, or inflammation present. Hgba1c negative for diabetes. Vitamin D level was low. Vitamin D helps to keep bones strong. You were prescribed ergocalciferol (capsules) to increase your vitamin-d level. Once finished start taking OTC vitamin d supplement with 800 international units (IU) of vitamin-d per day.  Recommend follow up in 3 months.

## 2016-07-16 NOTE — Telephone Encounter (Signed)
CMA call regarding lab results   Patient Verify DOB   Patient was aware and understood  

## 2016-07-19 ENCOUNTER — Ambulatory Visit: Payer: Managed Care, Other (non HMO) | Attending: Specialist

## 2016-07-19 DIAGNOSIS — M545 Low back pain: Secondary | ICD-10-CM | POA: Insufficient documentation

## 2016-07-19 DIAGNOSIS — R252 Cramp and spasm: Secondary | ICD-10-CM | POA: Diagnosis present

## 2016-07-19 NOTE — Patient Instructions (Signed)
Issued from cabinet stretching post pelvic tilt, lower trunk rot,  Prayer stretch, cat camel     3x/day 2-3 reps except PPT 10-15 reps 5-10 sec hold  Stretches 20-30 sec hold

## 2016-07-19 NOTE — Therapy (Signed)
[facility] [address] [place], [place], [address] Phone: [phone]   Fax:  [phone]  Physical Therapy Evaluation  Patient Details  Name: Dave MelterSidney Jimenez MRN: [id number] Date of Birth: 02/07/1970 Referring Provider: Vira BrownsJames Nitka, MD  Encounter Date: 07/19/2016      PT End of Session - 07/19/16 1502    Visit Number 1   Number of Visits 12   Date for PT Re-Evaluation 08/27/16   Authorization Type [employer]   PT Start Time 0300   PT Stop Time 0345   PT Time Calculation (min) 45 min   Activity Tolerance Patient tolerated treatment well   Behavior During Therapy [facility] for tasks assessed/performed      History reviewed. No pertinent past medical history.  Past Surgical History:  Procedure Laterality Date  . MASTECTOMY      There were no vitals filed for this visit.       Subjective Assessment - 07/19/16 1509    Subjective He reports  he fell at store and now with back pain 05/02/16 .    He reports previous back injury about 5 years ago.   He  was out of work until returns to work light duty after 20 days.     Limitations --  not playing basketball ,ride motor cycle,  ride in car long distances.   bending in standing   How long can you sit comfortably? depends on chair   How long can you stand comfortably? 10 min   How long can you walk comfortably? AS needed   Diagnostic tests MRI with some degenerative changes    Patient Stated Goals return to normal activity   Currently in Pain? Yes   Pain Score 5    Pain Location Back   Pain Orientation Lower;Posterior;Right;Left   Pain Descriptors / Indicators Tingling;Aching   Pain Type --  sub acute   Pain Radiating Towards RT  posterior leg to foot intermittant   Pain Onset More than a month ago   Pain Frequency Constant   Aggravating Factors  Bending and lifting , sitting, incr activity   Pain Relieving Factors back brace,  rest in tub, ice , medication   Multiple Pain Sites  No            [facility] PT Assessment - 07/19/16 0001      Assessment   Medical Diagnosis Acute LBP with sciatica   Referring Provider Vira BrownsJames Nitka, MD   Onset Date/Surgical Date 05/05/16   Next MD Visit 5 weeks   Prior Therapy no     Precautions   Precautions None;Other (comment)   Precaution Comments 25 pound lifting lift, limit lifting     Restrictions   Weight Bearing Restrictions No     Balance Screen   Has the patient fallen in the past 6 months Yes   How many times? 1   Has the patient had a decrease in activity level because of a fear of falling?  No   Is the patient reluctant to leave their home because of a fear of falling?  No     Prior Function   Level of Independence Independent   Vocation Full time employment   Vocation Requirements CNA work     Cognition   Overall Cognitive Status Within Functional Limits for tasks assessed     Coordination   Gross Motor Movements are Fluid and Coordinated Yes     Posture/Postural Control   Posture/Postural Control No significant limitations  ROM / Strength   AROM / PROM / Strength AROM;Strength;PROM     AROM   AROM Assessment Site Lumbar   Lumbar Flexion 70   Lumbar Extension 30   Lumbar - Right Side Bend 20   Lumbar - Left Side Bend 30     PROM   Overall PROM Comments stiffness of RT and LT hip RT more discomfort.      Strength   Overall Strength Comments WNL     Flexibility   Soft Tissue Assessment /Muscle Length --   Hamstrings 50 degrees bilaterally     Palpation   Palpation comment tender paraspinals bilateral      Ambulation/Gait   Gait Comments Normal            Objective measurements completed on examination: See above findings.                  PT Education - 07/19/16 1506    Education provided Yes   Education Details POC, HEP, Dry needle info   Person(s) Educated Patient   Methods Explanation;Tactile cues;Verbal cues;Handout   Comprehension Verbalized  understanding;Returned demonstration          PT Short Term Goals - 07/19/16 1550      PT SHORT TERM GOAL #1   Title He will be independent with inital HEP.    Time 3   Period Weeks   Status New     PT SHORT TERM GOAL #2   Title He will report pain decreased 40% or more   Time 3   Period Weeks   Status New     PT SHORT TERM GOAL #3   Title He will report able to sit in more types of chairs with less pain.    Time 3   Period Weeks   Status New     PT SHORT TERM GOAL #4   Title He will be able to stand for job for 30 min with 3-4 max pain   Time 3   Period Weeks   Status New           PT Long Term Goals - 07/19/16 1600      PT LONG TERM GOAL #1   Title He will be independent with all hEP issued   Time 6   Period Weeks   Status New     PT LONG TERM GOAL #2   Title He will report pain as intermittant inlower back and able to sit as needed for activity   Time 6   Period Weeks   Status New     PT LONG TERM GOAL #3   Title He will begin light sports activity   Time 6   Period Weeks   Status New     PT LONG TERM GOAL #4   Title He will be able to bend to tye shoes and other activity in standing .    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 1-2 max pain   Time 6   Period Weeks  Status New     PT LONG TERM GOAL #7   Title He will be able to lift 50 pounds floor to waist to progress toward full duty workl   Time 6   Period Weeks   Status New                Plan - 07/19/16 1503    Clinical Impression Statement Dave Jimenez presents with LBP and intermittant pain into RT leg .  He has some hip rotation stiffness causing som RT back pain  and tenderness paraspinals  on  lower back.  His transfers and bed mobility are normal .    Clinical Presentation --   Clinical Decision Making Low   Rehab Potential Good   PT Frequency 2x / week   PT Duration 6 weeks   PT Treatment/Interventions Electrical Stimulation;Iontophoresis 4mg /ml Dexamethasone;Moist Heat;Traction;Ultrasound;Passive range of motion;Patient/family education;Manual techniques;Therapeutic exercise;Therapeutic activities;Dry needling   PT Next Visit Plan Stab program . modalites as appropriate,, modalities   PT Home Exercise Plan LTR , PPT, cat/camel, prayer stretch   Consulted and Agree with Plan of Care Patient      Patient will benefit from skilled therapeutic intervention in order to improve the following deficits and impairments:  Pain, Decreased activity tolerance, Decreased range of motion, Increased muscle spasms  Visit Diagnosis: Acute low back pain, unspecified back pain laterality, with sciatica presence unspecified  Cramp and spasm     Problem List There are no active problems to display for this patient.   Caprice Red  PT 07/19/2016, 4:05 PM  [facility] [address] [place], [place], [address] Phone: [phone]   Fax:  [phone]  Name: Dave Jimenez MRN: [id number] Date of Birth: 15-Dec-1970

## 2016-07-21 NOTE — Progress Notes (Signed)
Agree with your assessment, thanks. jen

## 2016-07-27 ENCOUNTER — Telehealth: Payer: Self-pay

## 2016-07-27 ENCOUNTER — Ambulatory Visit: Payer: Managed Care, Other (non HMO)

## 2016-07-27 NOTE — Telephone Encounter (Signed)
Spoke with Mr Dave Jimenez and he reported he thought appointment was Wednesday. He was reminded of 7/12 appointment at 3 PM and he said he would be here

## 2016-07-29 ENCOUNTER — Ambulatory Visit: Payer: Managed Care, Other (non HMO)

## 2016-07-29 DIAGNOSIS — M545 Low back pain: Secondary | ICD-10-CM

## 2016-07-29 DIAGNOSIS — R252 Cramp and spasm: Secondary | ICD-10-CM

## 2016-07-29 NOTE — Therapy (Signed)
[facility] [address] [place], [place], [address] Phone: [phone]   Fax:  [phone]  Physical Therapy Treatment  Patient Details  Name: Dave Jimenez MRN: [id number] Date of Birth: 1970/05/29 Referring Provider: Vira Browns, MD  Encounter Date: 07/29/2016      PT End of Session - 07/29/16 1505    Visit Number 2   Number of Visits 12   Date for PT Re-Evaluation 08/27/16   Authorization Type [employer]   PT Start Time 0300   PT Stop Time 0400   PT Time Calculation (min) 60 min   Activity Tolerance Patient tolerated treatment well   Behavior During Therapy [facility] for tasks assessed/performed      History reviewed. No pertinent past medical history.  Past Surgical History:  Procedure Laterality Date  . MASTECTOMY      There were no vitals filed for this visit.      Subjective Assessment - 07/29/16 1546    Subjective About the same. Doing stretching at work and home as much as able.    Currently in Pain? Yes   Pain Score 5    Pain Location Back   Pain Orientation Right;Left;Lower;Posterior   Pain Descriptors / Indicators Aching;Tingling   Pain Type --  sub acute   Pain Onset More than a month ago   Pain Frequency Constant   Aggravating Factors  bend /lift / sit , work activity   Pain Relieving Factors brace, rest, ice, heat, med   Multiple Pain Sites No                         [facility] Adult PT Treatment/Exercise - 07/29/16 0001      Exercises   Exercises Lumbar     Lumbar Exercises: Stretches   Double Knee to Chest Stretch 2 reps;20 seconds   Lower Trunk Rotation Limitations 10 reps 3 sec RT and LT   Pelvic Tilt Limitations 12 reps 5 sec     Lumbar Exercises: Seated   Other Seated Lumbar Exercises stretch at pully for back and lats RT.LT 30 sec x 2 positions hand higher and lower on bar     Lumbar Exercises: Supine   Bent Knee Raise 15 reps   Bent Knee Raise Limitations RT/Lt    Bridge  10 reps;3 seconds   Other Supine Lumbar Exercises ball squeez x 12 5 sec hold,  clam green band x 12      Knee/Hip Exercises: Aerobic   Nustep L4 UE and LE 5 min     Modalities   Modalities Electrical Stimulation;Moist Heat     Moist Heat Therapy   Number Minutes Moist Heat 15 Minutes   Moist Heat Location Lumbar Spine     Electrical Stimulation   Electrical Stimulation Location Lumbar spine   Electrical Stimulation Action IFC   Electrical Stimulation Parameters To tolerance   Electrical Stimulation Goals Pain     Manual Therapy   Manual Therapy Joint mobilization;Soft tissue mobilization   Joint Mobilization PA glides Gr 2-3   L1 -5 60 reps   Soft tissue mobilization paraspinals lumbar                  PT Short Term Goals - 07/19/16 1550      PT SHORT TERM GOAL #1   Title He will be independent with inital HEP.    Time 3   Period Weeks   Status New  PT SHORT TERM GOAL #2   Title He will report pain decreased 40% or more   Time 3   Period Weeks   Status New     PT SHORT TERM GOAL #3   Title He will report able to sit in more types of chairs with less pain.    Time 3   Period Weeks   Status New     PT SHORT TERM GOAL #4   Title He will be able to stand for job for 30 min with 3-4 max pain   Time 3   Period Weeks   Status New           PT Long Term Goals - 07/19/16 1600      PT LONG TERM GOAL #1   Title He will be independent with all hEP issued   Time 6   Period Weeks   Status New     PT LONG TERM GOAL #2   Title He will report pain as intermittant inlower back and able to sit as needed for activity   Time 6   Period Weeks   Status New     PT LONG TERM GOAL #3   Title He will begin light sports activity   Time 6   Period Weeks   Status New     PT LONG TERM GOAL #4   Title He will be able to bend to tye shoes and other activity in standing .    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 1-2 max pain   Time 6   Period Weeks   Status New     PT LONG TERM GOAL #7   Title He will be able to lift 50 pounds floor to waist to progress toward full duty workl   Time 6   Period Weeks   Status New               Plan - 07/29/16 1507    Clinical Impression Statement No changes since eval. Tolerated exercises. Movements with exercises without hesitation.  morew tender RT with STW   PT Next Visit Plan Stab program . modalites as appropriate,, modalities   PT Home Exercise Plan LTR , PPT, cat/camel, prayer stretch   Consulted and Agree with Plan of Care Patient      Patient will benefit from skilled therapeutic intervention in order to improve the following deficits and impairments:  Pain, Decreased activity tolerance, Decreased range of motion, Increased muscle spasms  Visit Diagnosis: Acute low back pain, unspecified back pain laterality, with sciatica presence unspecified  Cramp and spasm     Problem List There are no active problems to display for this patient.   Caprice RedChasse, Oluwadamilola Deliz M PT 07/29/2016, 3:48 PM  [facility] [address] [place], [place], [address] Phone: [phone]   Fax:  [phone]  Name: Veneda MelterSidney Cleavenger MRN: [id number] Date of Birth: 03/03/1970

## 2016-07-31 ENCOUNTER — Ambulatory Visit
Admission: RE | Admit: 2016-07-31 | Discharge: 2016-07-31 | Disposition: A | Discharge status: Home / Self Care | Payer: Managed Care, Other (non HMO) | Source: Ambulatory Visit | Attending: Specialist | Admitting: Specialist

## 2016-07-31 DIAGNOSIS — N289 Disorder of kidney and ureter, unspecified: Secondary | ICD-10-CM

## 2016-07-31 MED ORDER — GADOBENATE DIMEGLUMINE 529 MG/ML IV SOLN: 17.0000 mL | Freq: Once | INTRAVENOUS | Status: DC | PRN

## 2016-08-03 ENCOUNTER — Telehealth (INDEPENDENT_AMBULATORY_CARE_PROVIDER_SITE_OTHER): Payer: Self-pay | Admitting: Radiology

## 2016-08-03 NOTE — Telephone Encounter (Signed)
Patient is calling this triage this morning pleading for appointment with Dr. Otelia SergeantNitka. He has been on modified light duty work 25lbs, done physical therapy as asked and is wanting to reevaluate work status. He states his work is bucking him. He needs money to pay for his children's school supplies and needs to make an income. 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 Appt made this Thursday at 845pm, hopefully with Fayrene FearingJames can help Dr. Otelia SergeantNitka see this heavy schedule.

## 2016-08-05 ENCOUNTER — Ambulatory Visit (INDEPENDENT_AMBULATORY_CARE_PROVIDER_SITE_OTHER): Payer: Managed Care, Other (non HMO) | Admitting: Specialist

## 2016-08-05 ENCOUNTER — Encounter (INDEPENDENT_AMBULATORY_CARE_PROVIDER_SITE_OTHER): Payer: Self-pay | Admitting: Specialist

## 2016-08-05 ENCOUNTER — Telehealth (INDEPENDENT_AMBULATORY_CARE_PROVIDER_SITE_OTHER): Payer: Self-pay | Admitting: Specialist

## 2016-08-05 VITALS — BP 128/91 | HR 78 | Ht 67.5 in | Wt 176.0 lb

## 2016-08-05 DIAGNOSIS — S335XXA Sprain of ligaments of lumbar spine, initial encounter: Secondary | ICD-10-CM | POA: Diagnosis not present

## 2016-08-05 DIAGNOSIS — M5136 Other intervertebral disc degeneration, lumbar region: Secondary | ICD-10-CM

## 2016-08-05 DIAGNOSIS — M5126 Other intervertebral disc displacement, lumbar region: Secondary | ICD-10-CM

## 2016-08-05 DIAGNOSIS — N2889 Other specified disorders of kidney and ureter: Secondary | ICD-10-CM | POA: Diagnosis not present

## 2016-08-05 MED ORDER — ACETAMINOPHEN 500 MG PO TABS: 500.0000 mg | ORAL_TABLET | Freq: Four times a day (QID) | ORAL | 2 refills | Status: DC | PRN

## 2016-08-05 MED ORDER — TRAMADOL HCL 50 MG PO TABS: 100.0000 mg | ORAL_TABLET | Freq: Four times a day (QID) | ORAL | 0 refills | Status: DC | PRN

## 2016-08-05 NOTE — Telephone Encounter (Signed)
CAN YOU PLEASE MAKE APPT BEFORE 08/25/16 PER NITKA  [phone]

## 2016-08-05 NOTE — Progress Notes (Addendum)
Office Visit Note   Patient: Dave Jimenez           Date of Birth: November 18, 1970           MRN: [id number] Visit Date: 08/05/2016              Requested by: Lizbeth Bark, FNP [address] [place], [place] [address] PCP: Lizbeth Bark, FNP   Assessment & Plan: Visit Diagnoses:  1. Other intervertebral disc degeneration, lumbar region   2. Herniation of lumbar intervertebral disc   3. Lumbar sprain, initial encounter   4. Mass of right kidney     Plan: Avoid frequent bending and stooping  No lifting greater than 50 lbs. May use ice or moist heat for pain. Physical therapy is extended to improve function. Increase lifting to 50 lbs, if pain worsens let us know. Return in follow up in 4 weeks.  I spoke with Dr. Mena Goes, urology about the right kidney mass and he said he would contact his office and arrange a sooner office appointment to evaluate and treat the lesion seen in the right kidney. [facility] number [phone].  Follow-Up Instructions: Return in about 2 weeks (around 08/19/2016) for follow up is necessary, please overbook for first week in August .   Orders:  Orders Placed This Encounter  Procedures  . Ambulatory referral to Physical Therapy   Meds ordered this encounter  Medications  . traMADol (ULTRAM) 50 MG tablet    Sig: Take 2 tablets (100 mg total) by mouth every 6 (six) hours as needed for moderate pain.    Dispense:  50 tablet    Refill:  0  . acetaminophen (TYLENOL) 500 MG tablet    Sig: Take 1 tablet (500 mg total) by mouth every 6 (six) hours as needed.    Dispense:  100 tablet    Refill:  2      Procedures: No procedures performed   Clinical Data: No additional findings.   Subjective: Chief Complaint  Patient presents with  . Lower Back - Follow-up    Re-eval for work status, and MRI review Abdomen     46 year old male with history of low back pain and related to fall at laundrymat slipping on a wet floor and  injuring the back. He has returned to a light duty capacity and presently he remains at a light duty, Monday through Friday. 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 No weight loss.     Review of Systems  Constitutional: Negative.   HENT: Negative.   Eyes: Negative.   Respiratory: Negative.   Cardiovascular: Negative.   Gastrointestinal: Negative.   Endocrine: Negative.   Genitourinary: Negative.   Musculoskeletal: Negative.   Skin: Negative.   Allergic/Immunologic: Negative.   Neurological: Negative.   Hematological: Negative.   Psychiatric/Behavioral: Negative.      Objective: Vital Signs: BP (!) 128/91 (BP Location: Left Arm, Patient Position: Sitting)   Pulse 78   Ht 5' 7.5" (1.715 m)   Wt 176 lb (79.8 kg)   BMI 27.16 kg/m   Physical Exam  Constitutional: He is oriented to person, place, and time. He appears well-developed and well-nourished.  HENT:  Head: Normocephalic and atraumatic.  Eyes: Pupils are equal, round, and reactive to light. EOM are normal.  Neck: Normal range of motion. Neck supple.  Pulmonary/Chest: Effort normal and breath sounds normal.  Abdominal: Soft. Bowel sounds  are normal.  Neurological: He is alert and oriented to person, place, and time.  Skin: Skin is warm and dry.  Psychiatric: He has a normal mood and affect. His behavior is normal. Judgment and thought content normal.    Back Exam   Tenderness  The patient is experiencing tenderness in the lumbar.  Range of Motion  Extension: abnormal  Flexion: abnormal  Lateral Bend Right: abnormal  Lateral Bend Left: abnormal  Rotation Right: abnormal  Rotation Left: abnormal   Muscle Strength  Right Quadriceps:  5/5  Left Quadriceps:  5/5  Right Hamstrings:  5/5  Left Hamstrings:  5/5   Tests  Straight leg raise right: positive Straight leg raise left: positive  Reflexes  Patellar: abnormal Achilles: abnormal Biceps:  abnormal Babinski's sign: normal   Other  Toe Walk: normal Heel Walk: normal Sensation: normal Gait: normal  Erythema: no back redness Scars: absent      Specialty Comments:  No specialty comments available.  Imaging: No results found.   PMFS History: There are no active problems to display for this patient.  No past medical history on file.  Family History  Problem Relation Age of Onset  . Hypertension Mother   . Heart disease Father     Past Surgical History:  Procedure Laterality Date  . 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 Smoking status: Current Every Day Smoker    Types: Cigars  . Smokeless tobacco: Never Used  . Alcohol use Yes     Comment: this past monday  . Drug use: Yes    Types: Marijuana     Comment: this past Monday  . Sexual activity: Not on file

## 2016-08-05 NOTE — Patient Instructions (Signed)
Avoid frequent bending and stooping  No lifting greater than 50 lbs. May use ice or moist heat for pain. Physical therapy is extended to improve function. Increase lifting to 50 lbs, if pain worsens let us know. Return in follow up in 4 weeks.  I spoke with Dr. Mena GoesEskridge, urology about the right kidney mass and he said he would contact his office and arrange a sooner office appointment to evaluate and treat the lesion seen in the right kidney. [facility] number [phone]. .Marland Kitchen

## 2016-08-06 NOTE — Telephone Encounter (Signed)
Staci, please call one of us to do this, so we don't loose the spot once we open it. Thanks.

## 2016-08-06 NOTE — Telephone Encounter (Signed)
Appt with Dr. Winifred OliveNtika or who?

## 2016-08-06 NOTE — Telephone Encounter (Signed)
Sure, get Toniann FailWendy or Elnita MaxwellCheryl to open a slot

## 2016-08-06 NOTE — Telephone Encounter (Signed)
Can an appt be opened up on 8/6  per Dr. Otelia SergeantNitka and per pt. I didn't have override to open one up.

## 2016-08-06 NOTE — Telephone Encounter (Signed)
Can an appt be opened up on 8/6  per Dr. Nitka and per pt. I didn't have override to open one up. °

## 2016-08-10 ENCOUNTER — Ambulatory Visit: Payer: Managed Care, Other (non HMO) | Admitting: Physical Therapy

## 2016-08-10 ENCOUNTER — Other Ambulatory Visit (INDEPENDENT_AMBULATORY_CARE_PROVIDER_SITE_OTHER): Payer: Self-pay | Admitting: Specialist

## 2016-08-10 ENCOUNTER — Encounter: Payer: Self-pay | Admitting: Physical Therapy

## 2016-08-10 DIAGNOSIS — R252 Cramp and spasm: Secondary | ICD-10-CM

## 2016-08-10 DIAGNOSIS — M545 Low back pain: Secondary | ICD-10-CM | POA: Diagnosis not present

## 2016-08-10 NOTE — Therapy (Signed)
[facility]patient [facility] [address] [place], [place], [address] Phone: [phone]   Fax:  [phone]  Physical Therapy Treatment  Patient Details  Name: Dave Jimenez MRN: [id number] Date of Birth: 1970-12-27 Referring Provider: Vira Browns, MD  Encounter Date: 08/10/2016      PT End of Session - 08/10/16 1022    Visit Number 3   Number of Visits 12   Date for PT Re-Evaluation 08/27/16   PT Start Time 1023  pt arrived 8 minutes late   PT Stop Time 1113   PT Time Calculation (min) 50 min   Activity Tolerance Patient tolerated treatment well   Behavior During Therapy [facility] for tasks assessed/performed      History reviewed. No pertinent past medical history.  Past Surgical History:  Procedure Laterality Date  . MASTECTOMY      There were no vitals filed for this visit.      Subjective Assessment - 08/10/16 1022    Subjective "I've been doing the exercises, and rode my bike for the first time the other day. I am still light duty at work"    Currently in Pain? Yes   Pain Score 3    Pain Location Back   Pain Orientation Right;Left   Pain Descriptors / Indicators Tightness   Pain Type Chronic pain   Pain Onset More than a month ago   Aggravating Factors  bend/ sitting of too long   Pain Relieving Factors brace, rest, ice, heat, med                         [facility] Adult PT Treatment/Exercise - 08/10/16 1103      Lumbar Exercises: Stretches   Single Knee to Chest Stretch 2 reps;30 seconds   Lower Trunk Rotation --  2 x 10     Lumbar Exercises: Seated   Other Seated Lumbar Exercises seated pelvic tilt 2 x 10  with ADIM     Lumbar Exercises: Supine   Bent Knee Raise 10 reps  with ADIM     Manual Therapy   Manual Therapy Soft tissue mobilization;Myofascial release   Joint Mobilization Grade 3 PA L1-L5   Soft tissue mobilization IASTM over bil lumbar paraspinals   Myofascial Release fascial stretching/  rolling over bil lumbar paraspinals          Trigger Point Dry Needling - 08/10/16 1034    Consent Given? Yes   Education Handout Provided Yes   Muscles Treated Upper Body Longissimus   Longissimus Response Twitch response elicited;Palpable increased muscle length              PT Education - 08/10/16 1040    Education provided Yes   Education Details muscle anatomy and referral patterns. what TPDN is, benefits/ what to expect and after care. posture/ lifting mechanics  posture education concurrent to DN   Person(s) Educated Patient   Methods Explanation;Verbal cues   Comprehension Verbalized understanding;Verbal cues required          PT Short Term Goals - 07/19/16 1550      PT SHORT TERM GOAL #1   Title He will be independent with inital HEP.    Time 3   Period Weeks   Status New     PT SHORT TERM GOAL #2   Title He will report pain decreased 40% or more   Time 3   Period Weeks   Status New     PT  SHORT TERM GOAL #3   Title He will report able to sit in more types of chairs with less pain.    Time 3   Period Weeks   Status New     PT SHORT TERM GOAL #4   Title He will be able to stand for job for 30 min with 3-4 max pain   Time 3   Period Weeks   Status New           PT Long Term Goals - 07/19/16 1600      PT LONG TERM GOAL #1   Title He will be independent with all hEP issued   Time 6   Period Weeks   Status New     PT LONG TERM GOAL #2   Title He will report pain as intermittant inlower back and able to sit as needed for activity   Time 6   Period Weeks   Status New     PT LONG TERM GOAL #3   Title He will begin light sports activity   Time 6   Period Weeks   Status New     PT LONG TERM GOAL #4   Title He will be able to bend to tye shoes and other activity in standing .    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 1-2 max pain   Time 6   Period Weeks   Status New     PT LONG TERM GOAL #7   Title He will be able to lift 50 pounds floor to waist to progress toward full duty workl   Time 6   Period Weeks   Status New               Plan - 08/10/16 1105    Clinical Impression Statement pt reports minimal changes since previous session. educated and performed TPDN over the lumbar paraspinals. IASTM and lumbar mobs performed following DN which he reported decreased tightness. educated on posture and core strengthening which updated HEP for seated pelvic tilts.MHP post session for soreness.    PT Next Visit Plan assess response to DN, core strengthening, lifting/ carrying activities with proper form, manual techniques PRN.    PT Home Exercise Plan LTR , PPT, cat/camel, prayer stretch, posture education, seated pelvic tilt.   Consulted and Agree with Plan of Care Patient      Patient will benefit from skilled therapeutic intervention in order to improve the following deficits and impairments:  Pain, Decreased activity tolerance, Decreased range of motion, Increased muscle spasms  Visit Diagnosis: Acute low back pain, unspecified back pain laterality, with sciatica presence unspecified  Cramp and spasm     Problem List There are no active problems to display for this patient.  Lulu RidingKristoffer Jaxon Mynhier PT, DPT, LAT, ATC  08/10/16  11:08 AM      [facility] [address] [place], [place], [address] Phone: [phone]   Fax:  [phone]  Name: Dave MelterSidney Jimenez MRN: [id number] Date of Birth: 01/21/1970

## 2016-08-10 NOTE — Patient Instructions (Addendum)

## 2016-08-11 ENCOUNTER — Other Ambulatory Visit: Payer: Self-pay | Admitting: Urology

## 2016-08-13 ENCOUNTER — Ambulatory Visit: Payer: Managed Care, Other (non HMO) | Admitting: Physical Therapy

## 2016-08-13 ENCOUNTER — Telehealth: Payer: Self-pay | Admitting: Physical Therapy

## 2016-08-13 NOTE — Telephone Encounter (Signed)
Spoke to patient regarding no- show to appointment this morning. He could not leave work today.  He rescheduled for Monday.

## 2016-08-16 ENCOUNTER — Ambulatory Visit: Payer: Managed Care, Other (non HMO) | Admitting: Physical Therapy

## 2016-08-16 DIAGNOSIS — R252 Cramp and spasm: Secondary | ICD-10-CM

## 2016-08-16 DIAGNOSIS — M545 Low back pain: Secondary | ICD-10-CM | POA: Diagnosis not present

## 2016-08-16 NOTE — Therapy (Addendum)
[place], [place], [address] Phone: [phone]   Fax:  [phone]  Physical Therapy Treatment  Patient Details  Name: Dave Jimenez MRN: [id number] Date of Birth: 1970/08/06 Referring Provider: Basil Dess, MD  Encounter Date: 08/16/2016      PT End of Session - 08/16/16 0723    Visit Number 4   Number of Visits 12   Date for PT Re-Evaluation 08/27/16   Authorization Type [employer]   PT Start Time [phone]   PT Stop Time 0800   PT Time Calculation (min) 43 min      No past medical history on file.  Past Surgical History:  Procedure Laterality Date  . MASTECTOMY      There were no vitals filed for this visit.      Subjective Assessment - 08/16/16 0723    Subjective I have a lot of pain with driving back and forth from [place]. MD found a mass on my right kidney. They are going to remove my kidney on Sept 12. I need to get back to work full duty.    Currently in Pain? Yes   Pain Score 3    Pain Location Back   Pain Orientation Right;Left;Lower   Pain Descriptors / Indicators Tightness   Aggravating Factors  driving, sitting    Pain Relieving Factors brace, rest                         [facility] Adult PT Treatment/Exercise - 08/16/16 0001      Lumbar Exercises: Stretches   Active Hamstring Stretch 2 reps;30 seconds   Single Knee to Chest Stretch 2 reps;30 seconds   Lower Trunk Rotation Limitations book openings x 5 each way      Lumbar Exercises: Aerobic   Stationary Bike Nustep L5 UE/LE x 5 minutes      Lumbar Exercises: Supine   Bent Knee Raise Limitations scissors level 2 alternating step taps    Bridge 10 reps;3 seconds   Bridge Limitations added ball squeeze with bridge x 10 , and clam with green band bridge x 10    Other Supine Lumbar Exercises ball squeez x 12 5 sec hold,  clam green band x 12                 PT Education - 08/16/16 0758    Education  provided Yes   Education Details HEP   Person(s) Educated Patient   Methods Explanation;Handout   Comprehension Verbalized understanding          PT Short Term Goals - 08/16/16 0751      PT SHORT TERM GOAL #1   Title He will be independent with inital HEP.    Time 3   Period Weeks   Status Achieved     PT SHORT TERM GOAL #2   Title He will report pain decreased 40% or more   Baseline 50%   Time 3   Period Weeks   Status Achieved     PT SHORT TERM GOAL #3   Title He will report able to sit in more types of chairs with less pain.    Baseline needs support in chairs and driving    Time 3   Period Weeks   Status On-going     PT SHORT TERM GOAL #4   Title He will be able to stand for job for 30 min with 3-4 max pain  Time 3   Period Weeks   Status Achieved           PT Long Term Goals - 07/19/16 1600      PT LONG TERM GOAL #1   Title He will be independent with all hEP issued   Time 6   Period Weeks   Status New     PT LONG TERM GOAL #2   Title He will report pain as intermittant inlower back and able to sit as needed for activity   Time 6   Period Weeks   Status New     PT LONG TERM GOAL #3   Title He will begin light sports activity   Time 6   Period Weeks   Status New     PT LONG TERM GOAL #4   Title He will be able to bend to tye shoes and other activity in standing .    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 1-2 max pain   Time 6   Period Weeks   Status New     PT LONG TERM GOAL #7   Title He will be able to lift 50 pounds floor to waist to progress toward full duty workl   Time 6   Period Weeks   Status New               Plan - 08/16/16 0747     Clinical Impression Statement Pt reports lumbar pain is mostly problematic with driving. 50% improved, no difficulty with normal standing. He is eager to return to full duty. STG# 1 ,2 4 met. Sees MD August 6th.    PT Next Visit Plan begin lifting, pushing, RTW duties, check LTGS    PT Home Exercise Plan LTR , PPT, cat/camel, prayer stretch, posture education, seated pelvic tilt, Supine table top with heel  taps    Consulted and Agree with Plan of Care Patient      Patient will benefit from skilled therapeutic intervention in order to improve the following deficits and impairments:  Pain, Decreased activity tolerance, Decreased range of motion, Increased muscle spasms  Visit Diagnosis: Acute low back pain, unspecified back pain laterality, with sciatica presence unspecified  Cramp and spasm     Problem List There are no active problems to display for this patient.   [place], Jessica McGee, PTA  08/16/2016, 9:11 AM  [facility] [address] [place], [place], [address] Phone: [phone]   Fax:  [phone]  Name: Dreydon Giarratano MRN: [id number] Date of Birth: 07/28/1970   

## 2016-08-19 ENCOUNTER — Encounter: Payer: Self-pay | Admitting: Licensed Clinical Social Worker

## 2016-08-19 ENCOUNTER — Ambulatory Visit: Payer: Managed Care, Other (non HMO) | Attending: Family Medicine | Admitting: Licensed Clinical Social Worker

## 2016-08-19 DIAGNOSIS — F418 Other specified anxiety disorders: Secondary | ICD-10-CM

## 2016-08-19 NOTE — BH Specialist Note (Signed)
Integrated Behavioral Health Follow Up Visit  MRN: [id number] Name: Dave MelterSidney Donathan   Session Start time: 4:30 PM Session End time: 5:00 PM Total time: 30 minutes Number of Integrated Behavioral Health Clinician visits: 2/10  Type of Service: Integrated Behavioral Health- Individual/Family Interpretor:No. Interpretor Name and Language: N/A   Warm Hand Off Completed.       SUBJECTIVE: Dave Jimenez is a 46 y.o. male accompanied by patient. Patient was referred by self for anxiety. Patient reports the following symptoms/concerns: feelings of worry, difficulty relaxing, irritability, and racing thoughts Duration of problem: four months; Severity of problem: moderate  OBJECTIVE: Mood: Anxious and Affect: Tearful Risk of harm to self or others: No plan to harm self or others   LIFE CONTEXT: Family and Social: Pt receives strong support from family and friends. He has two children (472 year old twins) and has an amiable relationship with their mother School/Work: Pt is employed as a Physiological scientistCNA full time Self-Care: Pt smokes marijuana to cope with chronic pain and stress Life Changes: Pt recently learned of a mass on his right kidney which will result in an upcoming surgery. He is experiencing financial strain from medical bills and is concerned about his health  GOALS ADDRESSED: Patient will reduce symptoms of: anxiety and increase knowledge and/or ability of: coping skills and also: Increase healthy adjustment to current life circumstances  INTERVENTIONS: Mindfulness or Relaxation Training and Supportive Counseling Standardized Assessments completed: Patient declined screening  ASSESSMENT: Patient currently experiencing anxiety triggered by ongoing medical concerns and financial strain from medical bills. He reports feelings of worry, difficulty relaxing, irritability, and racing thoughts. Patient may benefit from psychoeduation, psychotherapy, and medication management. Pt recently  learned of a mass on his right kidney which will result in an upcoming surgery. He is experiencing increased anxiety. LCSWA educated pt on the definition of cognitive distortions and how to identify irrational thoughts that can result in increased anxiety. Pt agreed to schedule an appointment with PCP to discuss questions and concerns about medical health. Pt is knowledgeable about crisis intervention, psychotherapy, and medication management.  PLAN: 1. Follow up with behavioral health clinician on : Pt was encouraged tocontact LCSWA if symptoms worsen or fail to improveto schedule [facility]. 2. Behavioral recommendations: LCSWA recommends that pt apply healthy coping skills discussed. Pt is encouraged to schedule follow up appointment with LCSWA 3. Referral(s[facility] 4. "From scale of 1-10, how likely are you to follow plan?": 8/10  Bridgett LarssonJasmine D Lewis, LCSW 08/23/16 2:18 PM

## 2016-08-20 ENCOUNTER — Ambulatory Visit: Payer: Managed Care, Other (non HMO) | Attending: Family Medicine | Admitting: Family Medicine

## 2016-08-20 VITALS — BP 130/76 | HR 80 | Temp 98.8°F | Resp 18 | Ht 67.0 in | Wt 175.0 lb

## 2016-08-20 DIAGNOSIS — M545 Low back pain: Secondary | ICD-10-CM | POA: Diagnosis not present

## 2016-08-20 DIAGNOSIS — N2889 Other specified disorders of kidney and ureter: Secondary | ICD-10-CM | POA: Insufficient documentation

## 2016-08-20 DIAGNOSIS — I10 Essential (primary) hypertension: Secondary | ICD-10-CM | POA: Insufficient documentation

## 2016-08-20 DIAGNOSIS — Z79899 Other long term (current) drug therapy: Secondary | ICD-10-CM | POA: Insufficient documentation

## 2016-08-20 MED ORDER — AMLODIPINE BESYLATE 5 MG PO TABS: 5.0000 mg | ORAL_TABLET | Freq: Every day | ORAL | 2 refills | Status: DC

## 2016-08-20 NOTE — Progress Notes (Signed)
Subjective:  [name], male    DOB: 02/28/1970  Age: 46 y.o. MRN: [id number]  CC: Hypertension   HPI Dave MelterSidney Pursifull presents for history of hypertension. He is not exercising and is not adherent to low salt diet.  Blood pressure: He does check BP at home. He reports SBP run in 150's. He reports not taking antihypertensives daily. Cardiac symptoms none. Patient denies none.  Cardiovascular risk factors: hypertension, male gender, sedentary lifestyle and smoking/ tobacco exposure. Use of agents associated with hypertension: NSAID'S. History of target organ damage: none.  History of lumbar back pain from history of fall. He does reports recent history of finding on MRI that showed renal mass. He reports being evaluated by urology with upcoming laparoscopy to address. He denies any abdominal pain, dysuria, or hematuria.     Outpatient Medications Prior to Visit  Medication Sig Dispense Refill  . acetaminophen (TYLENOL) 500 MG tablet Take 1 tablet (500 mg total) by mouth every 6 (six) hours as needed. 100 tablet 2  . hydrochlorothiazide (HYDRODIURIL) 25 MG tablet Take 0.5 tablets (12.5 mg total) by mouth daily. 30 tablet 2  . methocarbamol (ROBAXIN) 500 MG tablet Take 1 tablet (500 mg total) by mouth 3 (three) times daily. X 10 days then prn muscle spasm 90 tablet 0  . methylPREDNISolone (MEDROL) 4 MG tablet 6 day taper to be taken as directed 21 tablet 0  . pravastatin (PRAVACHOL) 20 MG tablet Take 1 tablet (20 mg total) by mouth daily. 30 tablet 2  . ranitidine (ZANTAC) 150 MG tablet Take 1 tablet (150 mg total) by mouth 2 (two) times daily as needed for heartburn.    . Vitamin D, Ergocalciferol, (DRISDOL) 50000 units CAPS capsule Take 1 capsule (50,000 Units total) by mouth every 7 (seven) days. 8 capsule 0  . traMADol (ULTRAM) 50 MG tablet Take 2 tablets (100 mg total) by mouth every 6 (six) hours as needed for moderate pain. 50 tablet 0   No facility-administered medications  prior to visit.     ROS Review of Systems  Constitutional: Negative.   Eyes: Negative.   Respiratory: Negative.   Cardiovascular: Negative.   Gastrointestinal: Negative.   Genitourinary: Negative.   Musculoskeletal: Negative.   Skin: Negative.   Neurological: Negative.   Psychiatric/Behavioral: Negative.    Objective:  BP 130/76 (BP Location: Right Arm, Patient Position: Sitting, Cuff Size: Normal)   Pulse 80   Temp 98.8 F (37.1 C) (Oral)   Resp 18   Ht 5\' 7"  (1.702 m)   Wt 175 lb (79.4 kg)   SpO2 97%   BMI 27.41 kg/m   BP/Weight 08/23/2016 08/20/2016 08/05/2016  Systolic BP 133 130 128  Diastolic BP 86 76 91  Wt. (Lbs) 176 175 176  BMI 27.16 27.41 27.16    Physical Exam  Constitutional: He appears well-developed and well-nourished.  Eyes: Pupils are equal, round, and reactive to light. Conjunctivae are normal.  Neck: No JVD present.  Cardiovascular: Normal rate, regular rhythm, normal heart sounds and intact distal pulses.   Pulmonary/Chest: Effort normal and breath sounds normal.  Abdominal: Soft. Bowel sounds are normal. There is no tenderness.  Skin: Skin is warm and dry.  Psychiatric: His mood appears anxious. He expresses no homicidal and no suicidal ideation. He expresses no suicidal plans and no homicidal plans.  Nursing note and vitals reviewed.  Assessment & Plan:   Problem List Items Addressed This Visit    None    Visit Diagnoses  Essential hypertension    -  Primary   Schedule BP recheck in 2 weeks with clinic RN.   If BP is greater than 90/60 (MAP 65 or greater) but not less than 130/80 may increase dose of amlodipine to 10 mg QD and recheck in another 2 weeks with clinic RN.   Follow up with PCP in 3 months.   Relevant Medications   amLODipine (NORVASC) 5 MG tablet   Renal mass           Follow up with urology.    Meds ordered this encounter  Medications  . DISCONTD: amLODipine (NORVASC) 5 MG tablet    Sig: Take 1 tablet (5 mg total) by  mouth daily.    Dispense:  30 tablet    Refill:  2    Order Specific Question:   Supervising Provider    Answer:   Quentin AngstJEGEDE, OLUGBEMIGA E [id number]]  . amLODipine (NORVASC) 5 MG tablet    Sig: Take 1 tablet (5 mg total) by mouth daily.    Dispense:  30 tablet    Refill:  2    Order Specific Question:   Supervising Provider    Answer:   Quentin AngstJEGEDE, OLUGBEMIGA E [id number]]    Follow-up: Return in about 2 weeks (around 09/03/2016) for BP check with Travia.   Lizbeth BarkMandesia R Hairston FNP

## 2016-08-20 NOTE — Patient Instructions (Addendum)
Follow up with urology specialist BP not at goal. Please take medication for blood pressure. Follow up in 2 weeks for BP check Avoid any medications that contain ibuprofen.    Renal Mass A renal mass is a growth in the kidney. Some masses are harmful and may cause cancer. Others are harmless. A renal mass may be solid or filled with fluid. Those that are filled with fluid are called cysts. What are the causes? Usually, the cause of a renal mass is unknown. However, certain types of cancers and infections can cause a renal mass. What are the signs or symptoms? Symptoms may include:  Blood in the urine.  Pain in the side or back (flank pain).  Feeling full soon after eating.  Weight loss.  Swelling in the abdomen.  Some renal masses do not cause symptoms. How is this diagnosed? A renal mass may be found with a CT scan, ultrasound, or MRI of your abdomen. How is this treated? Treatment will depend on the type of renal mass.  If the renal mass is a cyst that is not causing problems, you will not need treatment.  If the renal mass is a cyst that is causing problems, it may need to be drained during a type of surgery called laparoscopic surgery.  If the renal mass is solid, it may need to be removed with a surgery to your abdomen.  If the renal mass is caused by kidney cancer, you may need surgery to remove all or part of your kidney.  You may need to see your health care provider once or twice a year to have CT scans and ultrasounds done. Having these tests will allow your health care provider to see if your renal mass has changed or gotten bigger. Follow these instructions at home: What you need to do at home will depend on the type of renal mass that you have. The treatment you had also will make a difference. Follow the instructions your health care provider gives you. In general:  Keep all follow-up visits as directed by your health care provider.  Take medicines only as  directed by your health care provider.  Contact a health care provider if:  You have abdominal pain.  You have flank pain.  You have a fever. Get help right away if:  Your pain gets worse.  There is blood in your urine.  You cannot urinate.  You have chest pain.  You have trouble breathing. This information is not intended to replace advice given to you by your health care provider. Make sure you discuss any questions you have with your health care provider. Document Released: 08/01/2013 Document Revised: 06/10/2015 Document Reviewed: 02/28/2013 Elsevier Interactive Patient Education  2018 [employer]   DASH Eating Plan DASH stands for "Dietary Approaches to Stop Hypertension." The DASH eating plan is a healthy eating plan that has been shown to reduce high blood pressure (hypertension). It may also reduce your risk for type 2 diabetes, heart disease, and stroke. The DASH eating plan may also help with weight loss. What are tips for following this plan? General guidelines  Avoid eating more than 2,300 mg (milligrams) of salt (sodium) a day. If you have hypertension, you may need to reduce your sodium intake to 1,500 mg a day.  Limit alcohol intake to no more than 1 drink a day for nonpregnant women and 2 drinks a day for men. One drink equals 12 oz of beer, 5 oz of wine, or 1 oz  of hard liquor.  Work with your health care provider to maintain a healthy body weight or to lose weight. Ask what an ideal weight is for you.  Get at least 30 minutes of exercise that causes your heart to beat faster (aerobic exercise) most days of the week. Activities may include walking, swimming, or biking.  Work with your health care provider or diet and nutrition specialist (dietitian) to adjust your eating plan to your individual calorie needs. Reading food labels  Check food labels for the amount of sodium per serving. Choose foods with less than 5 percent of the Daily Value of sodium.  Generally, foods with less than 300 mg of sodium per serving fit into this eating plan.  To find whole grains, look for the word "whole" as the first word in the ingredient list. Shopping  Buy products labeled as "low-sodium" or "no salt added."  Buy fresh foods. Avoid canned foods and premade or frozen meals. Cooking  Avoid adding salt when cooking. Use salt-free seasonings or herbs instead of table salt or sea salt. Check with your health care provider or pharmacist before using salt substitutes.  Do not fry foods. Cook foods using healthy methods such as baking, boiling, grilling, and broiling instead.  Cook with heart-healthy oils, such as olive, canola, soybean, or sunflower oil. Meal planning   Eat a balanced diet that includes: ? 5 or more servings of fruits and vegetables each day. At each meal, try to fill half of your plate with fruits and vegetables. ? Up to 6-8 servings of whole grains each day. ? Less than 6 oz of lean meat, poultry, or fish each day. A 3-oz serving of meat is about the same size as a deck of cards. One egg equals 1 oz. ? 2 servings of low-fat dairy each day. ? A serving of nuts, seeds, or beans 5 times each week. ? Heart-healthy fats. Healthy fats called Omega-3 fatty acids are found in foods such as flaxseeds and coldwater fish, like sardines, salmon, and mackerel.  Limit how much you eat of the following: ? Canned or prepackaged foods. ? Food that is high in trans fat, such as fried foods. ? Food that is high in saturated fat, such as fatty meat. ? Sweets, desserts, sugary drinks, and other foods with added sugar. ? Full-fat dairy products.  Do not salt foods before eating.  Try to eat at least 2 vegetarian meals each week.  Eat more home-cooked food and less restaurant, buffet, and fast food.  When eating at a restaurant, ask that your food be prepared with less salt or no salt, if possible. What foods are recommended? The items listed may  not be a complete list. Talk with your dietitian about what dietary choices are best for you. Grains Whole-grain or whole-wheat bread. Whole-grain or whole-wheat pasta. Brown rice. Orpah Cobb. Bulgur. Whole-grain and low-sodium cereals. Pita bread. Low-fat, low-sodium crackers. Whole-wheat flour tortillas. Vegetables Fresh or frozen vegetables (raw, steamed, roasted, or grilled). Low-sodium or reduced-sodium tomato and vegetable juice. Low-sodium or reduced-sodium tomato sauce and tomato paste. Low-sodium or reduced-sodium canned vegetables. Fruits All fresh, dried, or frozen fruit. Canned fruit in natural juice (without added sugar). Meat and other protein foods Skinless chicken or [place]. Ground chicken or [place]. Pork with fat trimmed off. Fish and seafood. Egg whites. Dried beans, peas, or lentils. Unsalted nuts, nut butters, and seeds. Unsalted canned beans. Lean cuts of beef with fat trimmed off. Low-sodium, lean deli meat. Dairy Low-fat (  1%) or fat-free (skim) milk. Fat-free, low-fat, or reduced-fat cheeses. Nonfat, low-sodium ricotta or cottage cheese. Low-fat or nonfat yogurt. Low-fat, low-sodium cheese. Fats and oils Soft margarine without trans fats. Vegetable oil. Low-fat, reduced-fat, or light mayonnaise and salad dressings (reduced-sodium). Canola, safflower, olive, soybean, and sunflower oils. Avocado. Seasoning and other foods Herbs. Spices. Seasoning mixes without salt. Unsalted popcorn and pretzels. Fat-free sweets. What foods are not recommended? The items listed may not be a complete list. Talk with your dietitian about what dietary choices are best for you. Grains Baked goods made with fat, such as croissants, muffins, or some breads. Dry pasta or rice meal packs. Vegetables Creamed or fried vegetables. Vegetables in a cheese sauce. Regular canned vegetables (not low-sodium or reduced-sodium). Regular canned tomato sauce and paste (not low-sodium or reduced-sodium).  Regular tomato and vegetable juice (not low-sodium or reduced-sodium). Rosita FirePickles. Olives. Fruits Canned fruit in a light or heavy syrup. Fried fruit. Fruit in cream or butter sauce. Meat and other protein foods Fatty cuts of meat. Ribs. Fried meat. Tomasa BlaseBacon. Sausage. Bologna and other processed lunch meats. Salami. Fatback. Hotdogs. Bratwurst. Salted nuts and seeds. Canned beans with added salt. Canned or smoked fish. Whole eggs or egg yolks. Chicken or [place] with skin. Dairy Whole or 2% milk, cream, and half-and-half. Whole or full-fat cream cheese. Whole-fat or sweetened yogurt. Full-fat cheese. Nondairy creamers. Whipped toppings. Processed cheese and cheese spreads. Fats and oils Butter. Stick margarine. Lard. Shortening. Ghee. Bacon fat. Tropical oils, such as coconut, palm kernel, or palm oil. Seasoning and other foods Salted popcorn and pretzels. Onion salt, garlic salt, seasoned salt, table salt, and sea salt. Worcestershire sauce. Tartar sauce. Barbecue sauce. Teriyaki sauce. Soy sauce, including reduced-sodium. Steak sauce. Canned and packaged gravies. Fish sauce. Oyster sauce. Cocktail sauce. Horseradish that you find on the shelf. Ketchup. Mustard. Meat flavorings and tenderizers. Bouillon cubes. Hot sauce and Tabasco sauce. Premade or packaged marinades. Premade or packaged taco seasonings. Relishes. Regular salad dressings. Where to find more information:  National Heart, Lung, and Blood Institute: PopSteam.iswww.nhlbi.nih.gov  American Heart Association: www.heart.org Summary  The DASH eating plan is a healthy eating plan that has been shown to reduce high blood pressure (hypertension). It may also reduce your risk for type 2 diabetes, heart disease, and stroke.  With the DASH eating plan, you should limit salt (sodium) intake to 2,300 mg a day. If you have hypertension, you may need to reduce your sodium intake to 1,500 mg a day.  When on the DASH eating plan, aim to eat more fresh fruits and  vegetables, whole grains, lean proteins, low-fat dairy, and heart-healthy fats.  Work with your health care provider or diet and nutrition specialist (dietitian) to adjust your eating plan to your individual calorie needs. This information is not intended to replace advice given to you by your health care provider. Make sure you discuss any questions you have with your health care provider. Document Released: 12/24/2010 Document Revised: 12/29/2015 Document Reviewed: 12/29/2015 Elsevier Interactive Patient Education  2017 [employer]

## 2016-08-23 ENCOUNTER — Encounter (INDEPENDENT_AMBULATORY_CARE_PROVIDER_SITE_OTHER): Payer: Self-pay | Admitting: Specialist

## 2016-08-23 ENCOUNTER — Encounter: Payer: Self-pay | Admitting: Physical Therapy

## 2016-08-23 ENCOUNTER — Ambulatory Visit: Payer: Managed Care, Other (non HMO) | Attending: Specialist | Admitting: Physical Therapy

## 2016-08-23 ENCOUNTER — Ambulatory Visit (INDEPENDENT_AMBULATORY_CARE_PROVIDER_SITE_OTHER): Payer: Managed Care, Other (non HMO) | Admitting: Specialist

## 2016-08-23 VITALS — BP 133/86 | HR 68 | Ht 67.5 in | Wt 176.0 lb

## 2016-08-23 DIAGNOSIS — M5126 Other intervertebral disc displacement, lumbar region: Secondary | ICD-10-CM

## 2016-08-23 DIAGNOSIS — M545 Low back pain, unspecified: Secondary | ICD-10-CM

## 2016-08-23 DIAGNOSIS — M5136 Other intervertebral disc degeneration, lumbar region: Secondary | ICD-10-CM | POA: Diagnosis not present

## 2016-08-23 DIAGNOSIS — R252 Cramp and spasm: Secondary | ICD-10-CM | POA: Diagnosis present

## 2016-08-23 DIAGNOSIS — N2889 Other specified disorders of kidney and ureter: Secondary | ICD-10-CM | POA: Diagnosis not present

## 2016-08-23 DIAGNOSIS — S335XXA Sprain of ligaments of lumbar spine, initial encounter: Secondary | ICD-10-CM | POA: Diagnosis not present

## 2016-08-23 MED ORDER — TRAMADOL HCL 50 MG PO TABS: 100.0000 mg | ORAL_TABLET | Freq: Four times a day (QID) | ORAL | 0 refills | Status: DC | PRN

## 2016-08-23 NOTE — Therapy (Signed)
[place], [place], [address] Phone: [phone]   Fax:  [phone]  Physical Therapy Treatment  Patient Details  Name: Dave Jimenez MRN: [id number] Date of Birth: 03/10/1970 Referring Provider: Basil Dess, MD  Encounter Date: 08/23/2016      PT End of Session - 08/23/16 0813    Visit Number 5   Number of Visits 12   Date for PT Re-Evaluation 08/27/16   PT Start Time 0745  Short session patient called to let me know he was going to be late   PT Stop Time 0805   PT Time Calculation (min) 20 min   Activity Tolerance Patient tolerated treatment well;No increased pain   Behavior During Therapy [facility] for tasks assessed/performed      History reviewed. No pertinent past medical history.  Past Surgical History:  Procedure Laterality Date  . MASTECTOMY      There were no vitals filed for this visit.      Subjective Assessment - 08/23/16 0748    Subjective He has stopped Naproxen due to kidney surgery.  He is getting a new BP medication (New dose).. He has not picked it up yet.   Currently in Pain? No/denies   Pain Orientation Left;Right;Lower   Pain Descriptors / Indicators Tightness   Pain Type Chronic pain   Pain Radiating Towards No, light duty   Pain Frequency Intermittent   Aggravating Factors  sitting longer on a hard chair   Pain Relieving Factors change of position,                          [facility] Adult PT Treatment/Exercise - 08/23/16 0001      Therapeutic Activites    Therapeutic Activities Work Simulation   Work Simulation Push pull 20 feet with 20 LBS added to sled,      Lumbar Exercises: Stretches   Standing Extension 2 reps;10 seconds   Quadruped Mid Back Stretch Limitations Child's pose 5 X 10 seconds     Lumbar Exercises: Aerobic   Stationary Bike 4 minutes Le     Lumbar Exercises: Supine   Bridge Limitations 3 X 10,  both and single     Lumbar Exercises:  Quadruped   Single Arm Raise 5 reps   Opposite Arm/Leg Raise 5 reps   Opposite Arm/Leg Raise Limitations cues initially     Moist Heat Therapy   Number Minutes Moist Heat 5 Minutes   Moist Heat Location Lumbar Spine                PT Education - 08/23/16 [phone]    Education provided Yes   Education Details How to push / pull   Person(s) Educated Patient   Methods Explanation;Demonstration;Verbal cues   Comprehension Verbalized understanding;Returned demonstration          PT Short Term Goals - 08/23/16 0749      PT SHORT TERM GOAL #1   Title He will be independent with inital HEP.    Time 3   Period Weeks   Status Achieved     PT SHORT TERM GOAL #2   Title He will report pain decreased 40% or more   Time 3   Period Weeks   Status Achieved     PT SHORT TERM GOAL #3   Title He will report able to sit in more types of chairs with less pain.    Baseline Yes   Time  3   Period Weeks   Status Achieved     PT SHORT TERM GOAL #4   Title He will be able to stand for job for 30 min with 3-4 max pain   Time 3   Period Weeks   Status Achieved           PT Long Term Goals - 07/19/16 1600      PT LONG TERM GOAL #1   Title He will be independent with all hEP issued   Time 6   Period Weeks   Status New     PT LONG TERM GOAL #2   Title He will report pain as intermittant inlower back and able to sit as needed for activity   Time 6   Period Weeks   Status New     PT LONG TERM GOAL #3   Title He will begin light sports activity   Time 6   Period Weeks   Status New     PT LONG TERM GOAL #4   Title He will be able to bend to tye shoes and other activity in standing .    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 1-2 max pain   Time 6   Period Weeks   Status New     PT LONG TERM GOAL #7   Title He will be able to lift 50 pounds floor to waist to progress toward full duty workl   Time 6   Period Weeks   Status New               Plan - 08/23/16 7412    Clinical Impression Statement Short session.  patient late.  All STG's met.  Patient able to push and pull sled correctly with 20 LBS added 20 feet each. MD appointment this afternoon.  He is motivated to return to work.    PT Treatment/Interventions Electrical Stimulation;Iontophoresis 73m/ml Dexamethasone;Moist Heat;Traction;Ultrasound;Passive range of motion;Patient/family education;Manual techniques;Therapeutic exercise;Therapeutic activities;Dry needling   PT Next Visit Plan begin lifting, pushing, RTW duties, check LTGS    PT Home Exercise Plan LTR , PPT, cat/camel, prayer stretch, posture education, seated pelvic tilt, Supine table top with heel  taps    Consulted and Agree with Plan of Care Patient      Patient will benefit from skilled therapeutic intervention in order to improve the following deficits and impairments:     Visit Diagnosis: Acute low back pain, unspecified back pain laterality, with sciatica presence unspecified  Cramp and spasm     Problem List There are no active problems to display for this patient.   HARRIS,KAREN PTA 08/23/2016, 8:17 AM  [facility]GM[address] [place] [address]Phone: [phone]  Fax:  [phone] Name: SAntwon RochinMRN: [id number] of Birth: 129-May-1972

## 2016-08-23 NOTE — Patient Instructions (Signed)
Avoid frequent bending and stooping  No lifting greater than 75#. May use ice or moist heat for pain. May return to full duties, if pain recurs please call us and we with seen you sooner than the  Scheduled return appointment.

## 2016-08-23 NOTE — Progress Notes (Signed)
Office Visit Note   Patient: Dave Jimenez           Date of Birth: 01-Apr-1970           MRN: [id number] Visit Date: 08/23/2016              Requested by: Lizbeth Bark, FNP [address] [place], [place] [address] PCP: Lizbeth Bark, FNP   Assessment & Plan: Visit Diagnoses:  1. Acute midline low back pain without sciatica   2. Renal mass, right   3. Other intervertebral disc degeneration, lumbar region   4. Herniation of lumbar intervertebral disc   5. Lumbar sprain, initial encounter     Plan: Avoid frequent bending and stooping  No lifting greater than 75#. May use ice or moist heat for pain. May return to full duties, if pain recurs please call us and we with seen you sooner than the  Scheduled return appointment.  Follow-Up Instructions: Return in about 2 months (around 10/23/2016).   Orders:  No orders of the defined types were placed in this encounter.  Meds ordered this encounter  Medications  . traMADol (ULTRAM) 50 MG tablet    Sig: Take 2 tablets (100 mg total) by mouth every 6 (six) hours as needed for moderate pain.    Dispense:  50 tablet    Refill:  0      Procedures: No procedures performed   Clinical Data: No additional findings.   Subjective: Chief Complaint  Patient presents with  . 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 Increases with bending, stooping and lifing increase the pain. Has feelings of urgency and pressure sensation associated with a renal lesion. He is scheduled for surgical nephrectomy in the next week.     Review of Systems  Constitutional: Negative.   HENT: Negative.   Eyes: Negative.   Respiratory: Negative.   Cardiovascular: Negative.   Gastrointestinal: Negative.   Endocrine: Negative.   Genitourinary: Negative.     Musculoskeletal: Negative.   Skin: Negative.   Allergic/Immunologic: Negative.   Neurological: Negative.   Hematological: Negative.   Psychiatric/Behavioral: Negative.      Objective: Vital Signs: BP 133/86 (BP Location: Left Arm, Patient Position: Sitting, Cuff Size: Normal)   Pulse 68   Ht 5' 7.5" (1.715 m)   Wt 176 lb (79.8 kg)   BMI 27.16 kg/m   Physical Exam  Constitutional: He is oriented to person, place, and time. He appears well-developed and well-nourished.  HENT:  Head: Normocephalic and atraumatic.  Eyes: Pupils are equal, round, and reactive to light. EOM are normal.  Neck: Normal range of motion. Neck supple.  Pulmonary/Chest: Effort normal and breath sounds normal.  Abdominal: Soft. Bowel sounds are normal.  Neurological: He is alert and oriented to person, place, and time.  Skin: Skin is warm and dry.  Psychiatric: He has a normal mood and affect. His behavior is normal. Judgment and thought content normal.    Back Exam   Tenderness  The patient is experiencing tenderness in the lumbar.  Range of Motion  Extension: abnormal  Flexion:  70 abnormal  Lateral Bend Right: abnormal  Lateral Bend Left: abnormal  Rotation Right: abnormal  Rotation Left: abnormal   Muscle Strength  Right Quadriceps:  5/5  Left Quadriceps:  5/5  Right Hamstrings:  5/5  Left Hamstrings:  5/5   Tests  Straight leg raise right: negative Straight leg raise left: negative  Reflexes  Patellar: 1/4 Achilles: 1/4 Babinski's sign: normal   Other  Toe Walk: normal Heel Walk: normal Sensation: normal Gait: normal  Erythema: no back redness Scars: absent      Specialty Comments:  No specialty comments available.  Imaging: No results found.   PMFS History: There are no active problems to display for this patient.  No past medical history on file.  Family History  Problem Relation Age of Onset  . Hypertension Mother   . Heart disease Father     Past  Surgical History:  Procedure Laterality Date  . 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 Smoking status: Current Every Day Smoker    Types: Cigars  . Smokeless tobacco: Never Used  . Alcohol use Yes     Comment: this past monday  . Drug use: Yes    Types: Marijuana     Comment: this past Monday  . Sexual activity: Not on file

## 2016-08-26 ENCOUNTER — Ambulatory Visit: Payer: Managed Care, Other (non HMO) | Admitting: Physical Therapy

## 2016-08-26 ENCOUNTER — Encounter: Payer: Self-pay | Admitting: Physical Therapy

## 2016-08-26 DIAGNOSIS — M545 Low back pain: Secondary | ICD-10-CM

## 2016-08-26 DIAGNOSIS — R252 Cramp and spasm: Secondary | ICD-10-CM

## 2016-08-26 NOTE — Therapy (Signed)
[facility] [address] [place], [place], [address] Phone: [phone]   Fax:  [phone]  Physical Therapy Treatment  Patient Details  Name: Callie Bunyard MRN: [id number] Date of Birth: 1970/12/23 Referring Provider: Vira Browns, MD  Encounter Date: 08/26/2016      PT End of Session - 08/26/16 1850    Visit Number 6   Number of Visits 12   Date for PT Re-Evaluation 08/27/16   PT Start Time 0735   PT Stop Time 0806   PT Time Calculation (min) 31 min   Activity Tolerance Patient tolerated treatment well   Behavior During Therapy [facility] for tasks assessed/performed      History reviewed. No pertinent past medical history.  Past Surgical History:  Procedure Laterality Date  . MASTECTOMY      There were no vitals filed for this visit.      Subjective Assessment - 08/26/16 0742    Subjective 6/10.  Went to work and it was hard.  I was not ready.  I still have to work for 3 more weeks until surgery.   Currently in Pain? Yes   Pain Score 6    Pain Location Back   Pain Orientation Left;Right;Lower   Pain Descriptors / Indicators Tightness   Pain Type Chronic pain   Pain Frequency Intermittent   Aggravating Factors  full duty activities   Pain Relieving Factors change of position.  , PT                         [facility] Adult PT Treatment/Exercise - 08/26/16 0001      Lumbar Exercises: Stretches   Passive Hamstring Stretch 3 reps;30 seconds   Double Knee to Chest Stretch 5 reps   Double Knee to Chest Stretch Limitations legs on ball   Lower Trunk Rotation Limitations 5 x gentle stretches     Lumbar Exercises: Aerobic   Stationary Bike 6 minutes     Lumbar Exercises: Supine   Bent Knee Raise 10 reps   Bridge 10 reps   Other Supine Lumbar Exercises other stabilization exercises     Lumbar Exercises: Prone   Straight Leg Raise 5 reps   Other Prone Lumbar Exercises HIP stretches IR/ER PROM gentle      Lumbar Exercises: Quadruped   Single Arm Raise 5 reps     Moist Heat Therapy   Number Minutes Moist Heat 5 Minutes   Moist Heat Location Lumbar Spine                  PT Short Term Goals - 08/23/16 0749      PT SHORT TERM GOAL #1   Title He will be independent with inital HEP.    Time 3   Period Weeks   Status Achieved     PT SHORT TERM GOAL #2   Title He will report pain decreased 40% or more   Time 3   Period Weeks   Status Achieved     PT SHORT TERM GOAL #3   Title He will report able to sit in more types of chairs with less pain.    Baseline Yes   Time 3   Period Weeks   Status Achieved     PT SHORT TERM GOAL #4   Title He will be able to stand for job for 30 min with 3-4 max pain   Time 3   Period Weeks   Status Achieved  PT Long Term Goals - 08/26/16 1851      PT LONG TERM GOAL #1   Title He will be independent with all hEP issued   Baseline independent so far   Time 6   Period Weeks   Status On-going     PT LONG TERM GOAL #2   Title He will report pain as intermittant inlower back and able to sit as needed for activity   Time 6   Period Weeks   Status Unable to assess     PT LONG TERM GOAL #3   Title He will begin light sports activity   Baseline Not yet   Time 6   Period Weeks   Status On-going     PT LONG TERM GOAL #4   Title He will be able to bend to tye shoes and other activity in standing .    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 1-2 max pain   Time 6   Period Weeks   Status Unable to assess     PT LONG TERM GOAL #7   Title He will be able to lift 50 pounds floor to waist to progress toward full duty workl   Time 6   Period Weeks    Status Unable to assess               Plan - 08/26/16 1850    Clinical Impression Statement Exercises tolerated well with decreased tension and stiffness noted.  Rom in hips improved with stretching, however ROM continues to be limited.   PT Treatment/Interventions Electrical Stimulation;Iontophoresis 4mg /ml Dexamethasone;Moist Heat;Traction;Ultrasound;Passive range of motion;Patient/family education;Manual techniques;Therapeutic exercise;Therapeutic activities;Dry needling   PT Next Visit Plan begin lifting, pushing, RTW duties, check LTGS Stretch hips.   PT Home Exercise Plan LTR , PPT, cat/camel, prayer stretch, posture education, seated pelvic tilt, Supine table top with heel  taps    Consulted and Agree with Plan of Care Patient      Patient will benefit from skilled therapeutic intervention in order to improve the following deficits and impairments:     Visit Diagnosis: Acute low back pain, unspecified back pain laterality, with sciatica presence unspecified  Cramp and spasm     Problem List There are no active problems to display for this patient.   HARRIS,KAREN PTA 08/26/2016, 6:53 PM  [facility] [address] [place], [place], [address] Phone: [phone]   Fax:  [phone]  Name: Veneda MelterSidney Downard MRN: [id number] Date of Birth: 07/10/1970

## 2016-08-31 ENCOUNTER — Ambulatory Visit: Payer: Managed Care, Other (non HMO) | Admitting: Physical Therapy

## 2016-09-01 ENCOUNTER — Encounter: Payer: Self-pay | Admitting: Physical Therapy

## 2016-09-01 ENCOUNTER — Ambulatory Visit: Payer: Managed Care, Other (non HMO) | Admitting: Physical Therapy

## 2016-09-01 DIAGNOSIS — R252 Cramp and spasm: Secondary | ICD-10-CM

## 2016-09-01 DIAGNOSIS — M545 Low back pain: Secondary | ICD-10-CM | POA: Diagnosis not present

## 2016-09-01 NOTE — Patient Instructions (Signed)

## 2016-09-01 NOTE — Therapy (Signed)
[name], [place], [address] Phone: [phone]   Fax:  [phone]  Physical Therapy Treatment  Patient Details  Name: Dave Jimenez MRN: [id number] Date of Birth: September 28, 1970 Referring Provider: Basil Dess, MD  Encounter Date: 09/01/2016      PT End of Session - 09/01/16 1337    Visit Number 7   Number of Visits 12   Date for PT Re-Evaluation 08/27/16   PT Start Time 0847   PT Stop Time 0930   PT Time Calculation (min) 43 min   Activity Tolerance Patient tolerated treatment well   Behavior During Therapy [facility] for tasks assessed/performed      History reviewed. No pertinent past medical history.  Past Surgical History:  Procedure Laterality Date  . MASTECTOMY      There were no vitals filed for this visit.      Subjective Assessment - 09/01/16 1004    Subjective Pain continues with work.  He is able to follow his restrictions.  sitting continues to be limited and painful.   Currently in Pain? Yes   Pain Score 6   up to 8/10   Pain Orientation Left;Right;Lower   Pain Descriptors / Indicators Tingling;Tightness   Pain Type Chronic pain   Pain Radiating Towards right lef to calf   Pain Frequency Intermittent   Aggravating Factors  working, sitting   Pain Relieving Factors change of position, meds,  heat   Effect of Pain on Daily Activities limits ADL                         [facility] Adult PT Treatment/Exercise - 09/01/16 0001      Self-Care   Self-Care ADL's;Lifting;Posture   ADL's handout printed, not reviewed   Lifting instruction book, soft back issued .  briefly reviewed highlights.   Posture sitting posture practiced on sit fit,  exzplained distribution of forces with good and with bad posture.       Lumbar Exercises: Stretches   Passive Hamstring Stretch 2 reps;30 seconds   Lower Trunk Rotation Limitations 5 X legs on ball   Piriformis Stretch 1 rep;30 seconds   each     Lumbar Exercises: Seated   Long Arc Quad on [place] 5 reps  on sit fit   Hip Flexion on Ball 5 reps  on sit fit   Sit to Stand 10 reps   Other Seated Lumbar Exercises pelvic mobility,  dissassociation of pelvis from trunk,  min assist initially.     Lumbar Exercises: Supine   Bridge 10 reps   Bridge Limitations ball                PT Education - 09/01/16 1336    Education provided Yes   Education Details Posture/ADL/  Work Engineer, civil (consulting).   Person(s) Educated Patient   Methods Explanation;Demonstration;Verbal cues;Handout   Comprehension Verbalized understanding;Need further instruction          PT Short Term Goals - 08/23/16 0749      PT SHORT TERM GOAL #1   Title He will be independent with inital HEP.    Time 3   Period Weeks   Status Achieved     PT SHORT TERM GOAL #2   Title He will report pain decreased 40% or more   Time 3   Period Weeks   Status Achieved     PT SHORT TERM GOAL #3   Title He will  report able to sit in more types of chairs with less pain.    Baseline Yes   Time 3   Period Weeks   Status Achieved     PT SHORT TERM GOAL #4   Title He will be able to stand for job for 30 min with 3-4 max pain   Time 3   Period Weeks   Status Achieved           PT Long Term Goals - 09/01/16 0857      PT LONG TERM GOAL #1   Title He will be independent with all hEP issued   Baseline independent so far   Time 6   Period Weeks   Status On-going     PT LONG TERM GOAL #2   Title He will report pain as intermittant inlower back and able to sit as needed for activity   Baseline intermittant sometimes with muscle relaxer, unable to sit more than 10 minutes.   Time 6   Period Weeks   Status On-going     PT LONG TERM GOAL #3   Title He will begin light sports activity   Baseline able to ride his bike.    Time 6   Period Weeks   Status Partially Met     PT LONG TERM GOAL #4   Title He will be able to bend to tye shoes and  other activity in standing .    Baseline not yet.  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 1-2 max pain   Baseline  light duty, not doing   Time 6   Period Weeks   Status On-going     PT LONG TERM GOAL #7   Title He will be able to lift 50 pounds floor to waist to progress toward full duty workl   Baseline not doing   Time 6   Period Weeks   Status On-going               Plan - 09/01/16 1337    Clinical Impression Statement Exercise and education focus.  Flexibility improves with repition right hip.  Leg pain intermittant. Patient continues to need practice with push /  pull/  lifting. LTG#3 partially met.  No increased pain at end of session.  Leg tingling increased  during sitting exercises and was decreased with stretching.    PT Treatment/Interventions Electrical Stimulation;Iontophoresis 61m/ml Dexamethasone;Moist Heat;Traction;Ultrasound;Passive range of motion;Patient/family education;Manual techniques;Therapeutic exercise;Therapeutic activities;Dry needling   PT Next Visit Plan begin lifting, pushing, RTW duties, check LTGS Stretch hips.   PT Home Exercise Plan LTR , PPT, cat/camel, prayer stretch, posture education, seated pelvic tilt, Supine table top with heel  taps    Consulted and Agree with Plan of Care Patient      Patient will benefit from skilled therapeutic intervention in order to improve the following deficits and impairments:     Visit Diagnosis: Acute low back pain, unspecified back pain laterality, with sciatica presence unspecified  Cramp and spasm     Problem List There are no active problems to display for this  patient.   Elnora Quizon PTA 09/01/2016, 1:42 PM  [facility] [address] [place], [place], [address] Phone: [phone]   Fax:  [phone]  Name: Dave Jimenez MRN: [id number] Date of Birth: Jun 29, 1970

## 2016-09-03 ENCOUNTER — Other Ambulatory Visit: Payer: Self-pay | Admitting: Family Medicine

## 2016-09-03 ENCOUNTER — Ambulatory Visit: Payer: Managed Care, Other (non HMO) | Attending: Family Medicine | Admitting: *Deleted

## 2016-09-03 VITALS — BP 118/84 | HR 84 | Resp 20

## 2016-09-03 DIAGNOSIS — I10 Essential (primary) hypertension: Secondary | ICD-10-CM

## 2016-09-03 DIAGNOSIS — E782 Mixed hyperlipidemia: Secondary | ICD-10-CM | POA: Diagnosis not present

## 2016-09-03 MED ORDER — PRAVASTATIN SODIUM 20 MG PO TABS: 20.0000 mg | ORAL_TABLET | Freq: Every day | ORAL | 2 refills | Status: DC

## 2016-09-03 NOTE — Progress Notes (Signed)
Pt arrived to [facility], alert and oriented and arrives in good spirits. He expresses some medical and personal stressors.   Last OV 08/20/2016 with PCP, M. Hairston,FNP.   Pt denies chest pain, SOB, dizziness, or blurred vision.  He took Amlodipine for 3 days and stopped medication due to headache. He only takes HCTZ.   Verified medication and pt states medication was taken this morning.  Manual blood pressure reading: 124/82 and 118/84.  Pt aware of follow up appointment with PCP in September.

## 2016-09-07 ENCOUNTER — Ambulatory Visit: Payer: Managed Care, Other (non HMO) | Admitting: Physical Therapy

## 2016-09-07 ENCOUNTER — Encounter: Payer: Self-pay | Admitting: Physical Therapy

## 2016-09-07 DIAGNOSIS — M545 Low back pain: Secondary | ICD-10-CM | POA: Diagnosis not present

## 2016-09-07 DIAGNOSIS — R252 Cramp and spasm: Secondary | ICD-10-CM

## 2016-09-07 NOTE — Therapy (Signed)
[place], [place], [address] Phone: [phone]   Fax:  [phone]  Physical Therapy Treatment  Patient Details  Name: Dave Jimenez MRN: [id number] Date of Birth: 1970/11/16 Referring Provider: Basil Dess, MD  Encounter Date: 09/07/2016      PT End of Session - 09/07/16 1528    Visit Number 8   Number of Visits 12   Date for PT Re-Evaluation 09/29/16   Authorization Type [employer]   PT Start Time [phone]   PT Stop Time 1025   PT Time Calculation (min) 51 min   Activity Tolerance Patient tolerated treatment well   Behavior During Therapy [facility] for tasks assessed/performed      History reviewed. No pertinent past medical history.  Past Surgical History:  Procedure Laterality Date  . MASTECTOMY      There were no vitals filed for this visit.      Subjective Assessment - 09/07/16 1524    Subjective Pt reporting 4-5/10 pain today with certain positions. Pt has been doing his exercises at home and reporting that he is scheduled to have his right kidney removed on 09/29/16.    How long can you sit comfortably? depends on chair   How long can you stand comfortably? 15-20 minutes   How long can you walk comfortably? AS needed   Diagnostic tests MRI with some degenerative changes    Patient Stated Goals return to normal activity   Currently in Pain? Yes   Pain Score 5    Pain Location Back   Pain Orientation Lower   Pain Descriptors / Indicators Tightness;Aching;Sore   Pain Type Chronic pain   Pain Radiating Towards down R LE   Pain Onset More than a month ago   Pain Frequency Intermittent   Aggravating Factors  working, sitting    Pain Relieving Factors changing positions, meds, heat   Effect of Pain on Daily Activities limits work and ADL's            [facility] PT Assessment - 09/07/16 0001      Assessment   Medical Diagnosis Acute LBP with sciatica   Referring Provider Basil Dess, MD   Onset  Date/Surgical Date 05/05/16   Next MD Visit 5 weeks   Prior Therapy no     Precautions   Precautions None;Other (comment)   Precaution Comments 25 pound lifting lift, limit lifting     Restrictions   Weight Bearing Restrictions No     Coordination   Gross Motor Movements are Fluid and Coordinated Yes     AROM   AROM Assessment Site Lumbar   Lumbar Flexion 75   Lumbar Extension 32   Lumbar - Right Side Bend 24   Lumbar - Left Side Bend 30     Flexibility   Hamstrings R: 60, L: 58     Palpation   Palpation comment tenderness still noted over lumbar paraspinals     Ambulation/Gait   Gait Comments Normal                     [facility] Adult PT Treatment/Exercise - 09/07/16 0001      Lumbar Exercises: Stretches   Passive Hamstring Stretch 2 reps;30 seconds   Double Knee to Chest Stretch 5 reps   Double Knee to Chest Stretch Limitations legs on ball   Lower Trunk Rotation Limitations 5 X legs on ball   Piriformis Stretch 1 rep;30 seconds  each  Lumbar Exercises: Aerobic   Stationary Bike 6 minutes     Lumbar Exercises: Seated   Long Arc Quad on [place] 5 reps  on sit fit   Hip Flexion on Ball 5 reps  on sit fit   Sit to Stand 10 reps     Lumbar Exercises: Supine   Bridge 10 reps   Bridge Limitations ball   Other Supine Lumbar Exercises core stablization on therapy ball, Anterior and Posterior plevic tilts, Opposite leg and arm lifts x 10 alternating     Moist Heat Therapy   Number Minutes Moist Heat 10 Minutes   Moist Heat Location Lumbar Spine                PT Education - 09/07/16 1527    Education Details Reviewed Home Exercises   Person(s) Educated Patient   Methods Explanation;Demonstration   Comprehension Verbalized understanding;Returned demonstration          PT Short Term Goals - 08/23/16 0749      PT SHORT TERM GOAL #1   Title He will be independent with inital HEP.    Time 3   Period Weeks   Status Achieved     PT  SHORT TERM GOAL #2   Title He will report pain decreased 40% or more   Time 3   Period Weeks   Status Achieved     PT SHORT TERM GOAL #3   Title He will report able to sit in more types of chairs with less pain.    Baseline Yes   Time 3   Period Weeks   Status Achieved     PT SHORT TERM GOAL #4   Title He will be able to stand for job for 30 min with 3-4 max pain   Time 3   Period Weeks   Status Achieved           PT Long Term Goals - 09/07/16 1529      PT LONG TERM GOAL #1   Title He will be independent with all hEP issued   Status On-going     PT LONG TERM GOAL #2   Title He will report pain as intermittant inlower back and able to sit as needed for activity   Baseline intermittant sometimes with muscle relaxer, unable to sit more than 10 minutes.   Period Weeks   Status On-going     PT LONG TERM GOAL #3   Title He will begin light sports activity   Baseline able to ride his bike.    Time 6   Period Weeks   Status Partially Met     PT LONG TERM GOAL #4   Title He will be able to bend to tye shoes and other activity in standing .    Baseline not yet.  needs to sit and raise his leg   Time 6   Period Weeks   Status On-going               Plan - 09/07/16 1530    Clinical Impression Statement Pt arriving to therapy today reporting he is scheduled for right kidney removal on 09/29/16. Pt is continuing to make progress with therapy and progressing toward goals set. I have applied for a date extension for pt's 4 additional therapy visits and recommend following surgery once released by surgeon, pt to continue therapy for low back pain with the below interventions. Pt tolerated today's session well with 3-4/10 reported at end of  session. Continiue therapy as tolerated.    Rehab Potential Good   PT Frequency 2x / week   PT Duration 6 weeks   PT Treatment/Interventions Electrical Stimulation;Iontophoresis 26m/ml Dexamethasone;Moist  Heat;Traction;Ultrasound;Passive range of motion;Patient/family education;Manual techniques;Therapeutic exercise;Therapeutic activities;Dry needling   PT Next Visit Plan Applied for a date extension for the additional 4 visits to be completed before pt's kidney surgery, begin lifting, pushing, RTW duties, Stretch hips, core strengtheing/stability   PT Home Exercise Plan LTR , PPT, cat/camel, prayer stretch, posture education, seated pelvic tilt, Supine table top with heel  taps    Consulted and Agree with Plan of Care Patient      Patient will benefit from skilled therapeutic intervention in order to improve the following deficits and impairments:  Pain, Decreased activity tolerance, Decreased range of motion, Increased muscle spasms  Visit Diagnosis: Acute low back pain, unspecified back pain laterality, with sciatica presence unspecified - Plan: PT plan of care cert/re-cert  Cramp and spasm - Plan: PT plan of care cert/re-cert     Problem List There are no active problems to display for this patient.   JOretha Caprice MPT 09/07/2016, 3:44 PM  [facility] [address]Phone: [phone]  Fax:  [phone] Name: SShayaan ParkeMRN: [id number] of Birth: 109-07-1970

## 2016-09-09 ENCOUNTER — Ambulatory Visit: Payer: Managed Care, Other (non HMO) | Admitting: Physical Therapy

## 2016-09-09 DIAGNOSIS — M545 Low back pain: Secondary | ICD-10-CM | POA: Diagnosis not present

## 2016-09-09 DIAGNOSIS — R252 Cramp and spasm: Secondary | ICD-10-CM

## 2016-09-09 NOTE — Therapy (Signed)
[place], [place], [address] Phone: [phone]   Fax:  [phone]  Physical Therapy Treatment  Patient Details  Name: Dave Jimenez MRN: [id number] Date of Birth: 08/30/1970 Referring Provider: Basil Dess, MD  Encounter Date: 09/09/2016      PT End of Session - 09/09/16 0935    Visit Number 9   Number of Visits 12   Date for PT Re-Evaluation 09/29/16   Authorization Type [employer]   PT Start Time 0856  10 min late   PT Stop Time 0930   PT Time Calculation (min) 34 min      No past medical history on file.  Past Surgical History:  Procedure Laterality Date  . MASTECTOMY      There were no vitals filed for this visit.      Subjective Assessment - 09/09/16 1249    Subjective Just a little stiff from sleeping on the couch and I over slept so I am late    Currently in Pain? No/denies                         [facility] Adult PT Treatment/Exercise - 09/09/16 0001      Therapeutic Activites    Therapeutic Activities Lifting   Lifting 50# x 10, 75# x 2 knee to waist leifting box, cues for neutral spine and abdominal contraction      Lumbar Exercises: Aerobic   Stationary Bike 6 minutes     Lumbar Exercises: Machines for Strengthening   Leg Press 55# x 20    Other Lumbar Machine Exercise Free motion rowing 20# Bilateral shoulder flexion (pushing with one step forward in mini lunge) x 20, rotational stabilization 3# x 10 bilateral in semi lunge, also D2 flexion with mini squat x 10 each all with cues for core and neutral spine.    Other Lumbar Machine Exercise Lat Pull down standing 25#      Lumbar Exercises: Seated   Sit to Stand Limitations cues for abmonal bracing                   PT Short Term Goals - 08/23/16 0749      PT SHORT TERM GOAL #1   Title He will be independent with inital HEP.    Time 3   Period Weeks   Status Achieved     PT SHORT TERM GOAL #2   Title He will report pain decreased 40% or more   Time 3   Period Weeks   Status Achieved     PT SHORT TERM GOAL #3   Title He will report able to sit in more types of chairs with less pain.    Baseline Yes   Time 3   Period Weeks   Status Achieved     PT SHORT TERM GOAL #4   Title He will be able to stand for job for 30 min with 3-4 max pain   Time 3   Period Weeks   Status Achieved           PT Long Term Goals - 09/07/16 1529      PT LONG TERM GOAL #1   Title He will be independent with all hEP issued   Status On-going     PT LONG TERM GOAL #2   Title He will report pain as intermittant inlower back and able to sit as needed for activity   Baseline intermittant  sometimes with muscle relaxer, unable to sit more than 10 minutes.   Period Weeks   Status On-going     PT LONG TERM GOAL #3   Title He will begin light sports activity   Baseline able to ride his bike.    Time 6   Period Weeks   Status Partially Met     PT LONG TERM GOAL #4   Title He will be able to bend to tye shoes and other activity in standing .    Baseline not yet.  needs to sit and raise his leg   Time 6   Period Weeks   Status On-going               Plan - 09/09/16 0940    Clinical Impression Statement Pt reports 1/10 soreness from sleeping on couch. Challenged functional strength, work simulation and lifting exercises. Introduced Investment banker, corporate.   Pt reports no increased pain just mild discomfort.    PT Next Visit Plan 3 visits to be completed before pt's kidney surgery, continue  lifting, pushing, RTW duties, Stretch hips, core strengtheing/stability in standing    PT Home Exercise Plan LTR , PPT, cat/camel, prayer stretch, posture education, seated pelvic tilt, Supine table top with heel  taps    Consulted and Agree with Plan of Care Patient      Patient will benefit from skilled therapeutic intervention in order to improve the following deficits and impairments:  Pain, Decreased  activity tolerance, Decreased range of motion, Increased muscle spasms  Visit Diagnosis: Acute low back pain, unspecified back pain laterality, with sciatica presence unspecified  Cramp and spasm     Problem List There are no active problems to display for this patient.   Dorene Ar, [place] 09/09/2016, 12:50 PM  [facility] [address] [place], [place], [address] Phone: [phone]   Fax:  [phone]  Name: Dave Jimenez MRN: [id number] Date of Birth: 1970/03/27

## 2016-09-14 ENCOUNTER — Ambulatory Visit: Payer: Managed Care, Other (non HMO) | Admitting: Physical Therapy

## 2016-09-21 NOTE — Patient Instructions (Addendum)
Dave MelterSidney Jimenez  09/21/2016   Your procedure is scheduled on: 09-29-16   [facility]  [facility]trance [facility]  elevators to 3rd floor to  Short Stay Center at 6:30  AM.   Call this number if you have problems the morning of surgery [phone]    Remember: ONLY 1 PERSON MAY GO WITH YOU TO SHORT STAY TO GET  READY MORNING OF YOUR SURGERY.  Do not eat food or drink liquids :After Midnight.     Take these medicines the morning of surgery with A SIP OF WATER: Pravastatin (Pravachol)                                  You may not have any metal on your body including hair pins and              piercings  Do not wear jewelry, make-up, lotions, powders or perfumes, deodorant             Men may shave face and neck.   Do not bring valuables to the hospital. [facility] IS NOT             RESPONSIBLE   FOR VALUABLES.  Contacts, dentures or bridgework may not be worn into surgery.  Leave suitcase in the car. After surgery it may be brought to your room.                 Please read over the following fact sheets you were given: _____________________________________________________________________             [facility] - Preparing for Surgery Before surgery, you can play an important role.  Because skin is not sterile, your skin needs to be as free of germs as possible.  You can reduce the number of germs on your skin by washing with CHG (chlorahexidine gluconate) soap before surgery.  CHG is an antiseptic cleaner which kills germs and bonds with the skin to continue killing germs even after washing. Please DO NOT use if you have an allergy to CHG or antibacterial soaps.  If your skin becomes reddened/irritated stop using the CHG and inform your nurse when you arrive at Short Stay. Do not shave (including legs and underarms) for at least 48 hours prior to the first CHG shower.  You may shave your face/neck. Please follow these instructions carefully:  1.   Shower with CHG Soap the night before surgery and the  morning of Surgery.  2.  If you choose to wash your hair, wash your hair first as usual with your  normal  shampoo.  3.  After you shampoo, rinse your hair and body thoroughly to remove the  shampoo.                           4.  Use CHG as you would any other liquid soap.  You can apply chg directly  to the skin and wash                       Gently with a scrungie or clean washcloth.  5.  Apply the CHG Soap to your body ONLY FROM THE NECK DOWN.   Do not use on face/ open  Wound or open sores. Avoid contact with eyes, ears mouth and genitals (private parts).                       Wash face,  Genitals (private parts) with your normal soap.             6.  Wash thoroughly, paying special attention to the area where your surgery  will be performed.  7.  Thoroughly rinse your body with warm water from the neck down.  8.  DO NOT shower/wash with your normal soap after using and rinsing off  the CHG Soap.                9.  Pat yourself dry with a clean towel.            10.  Wear clean pajamas.            11.  Place clean sheets on your bed the night of your first shower and do not  sleep with pets. Day of Surgery : Do not apply any lotions/deodorants the morning of surgery.  Please wear clean clothes to the hospital/surgery center.  FAILURE TO FOLLOW THESE INSTRUCTIONS MAY RESULT IN THE CANCELLATION OF YOUR SURGERY PATIENT SIGNATURE_________________________________  NURSE SIGNATURE__________________________________  ________________________________________________________________________  WHAT IS A BLOOD TRANSFUSION? Blood Transfusion Information  A transfusion is the replacement of blood or some of its parts. Blood is made up of multiple cells which provide different functions.  Red blood cells carry oxygen and are used for blood loss replacement.  White blood cells fight against infection.  Platelets control  bleeding.  Plasma helps clot blood.  Other blood products are available for specialized needs, such as hemophilia or other clotting disorders. BEFORE THE TRANSFUSION  Who gives blood for transfusions?   Healthy volunteers who are fully evaluated to make sure their blood is safe. This is blood bank blood. Transfusion therapy is the safest it has ever been in the practice of medicine. Before blood is taken from a donor, a complete history is taken to make sure that person has no history of diseases nor engages in risky social behavior (examples are intravenous drug use or sexual activity with multiple partners). The donor's travel history is screened to minimize risk of transmitting infections, such as malaria. The donated blood is tested for signs of infectious diseases, such as HIV and hepatitis. The blood is then tested to be sure it is compatible with you in order to minimize the chance of a transfusion reaction. If you or a relative donates blood, this is often done in anticipation of surgery and is not appropriate for emergency situations. It takes many days to process the donated blood. RISKS AND COMPLICATIONS Although transfusion therapy is very safe and saves many lives, the main dangers of transfusion include:   Getting an infectious disease.  Developing a transfusion reaction. This is an allergic reaction to something in the blood you were given. Every precaution is taken to prevent this. The decision to have a blood transfusion has been considered carefully by your caregiver before blood is given. Blood is not given unless the benefits outweigh the risks. AFTER THE TRANSFUSION  Right after receiving a blood transfusion, you will usually feel much better and more energetic. This is especially true if your red blood cells have gotten low (anemic). The transfusion raises the level of the red blood cells which carry oxygen, and this usually causes an energy increase.  The  nurse  administering the transfusion will monitor you carefully for complications. HOME CARE INSTRUCTIONS  No special instructions are needed after a transfusion. You may find your energy is better. Speak with your caregiver about any limitations on activity for underlying diseases you may have. SEEK MEDICAL CARE IF:   Your condition is not improving after your transfusion.  You develop redness or irritation at the intravenous (IV) site. SEEK IMMEDIATE MEDICAL CARE IF:  Any of the following symptoms occur over the next 12 hours:  Shaking chills.  You have a temperature by mouth above 102 F (38.9 C), not controlled by medicine.  Chest, back, or muscle pain.  People around you feel you are not acting correctly or are confused.  Shortness of breath or difficulty breathing.  Dizziness and fainting.  You get a rash or develop hives.  You have a decrease in urine output.  Your urine turns a dark color or changes to pink, red, or brown. Any of the following symptoms occur over the next 10 days:  You have a temperature by mouth above 102 F (38.9 C), not controlled by medicine.  Shortness of breath.  Weakness after normal activity.  The white part of the eye turns yellow (jaundice).  You have a decrease in the amount of urine or are urinating less often.  Your urine turns a dark color or changes to pink, red, or brown. Document Released: 01/02/2000 Document Revised: 03/29/2011 Document Reviewed: 08/21/2007 [facility] Patient Information 2014 [place], [place].  _______________________________________________________________________

## 2016-09-22 ENCOUNTER — Ambulatory Visit: Payer: Managed Care, Other (non HMO) | Attending: Specialist | Admitting: Physical Therapy

## 2016-09-22 DIAGNOSIS — M545 Low back pain: Secondary | ICD-10-CM | POA: Insufficient documentation

## 2016-09-22 DIAGNOSIS — R252 Cramp and spasm: Secondary | ICD-10-CM | POA: Insufficient documentation

## 2016-09-22 NOTE — Therapy (Addendum)
[place], [place], [address] Phone: [phone]   Fax:  [phone]  Physical Therapy Treatment/Discharge  Patient Details  Name: Dave Jimenez MRN: [id number] Date of Birth: 1970/03/13 Referring Provider: Basil Dess, MD  Encounter Date: 09/22/2016      PT End of Session - 09/22/16 1151    Visit Number 10   Number of Visits 12   Date for PT Re-Evaluation 09/29/16   Authorization Type [employer]   PT Start Time 1147   PT Stop Time 1227   PT Time Calculation (min) 40 min      No past medical history on file.  Past Surgical History:  Procedure Laterality Date  . MASTECTOMY      There were no vitals filed for this visit.      Subjective Assessment - 09/22/16 1149    Subjective Doing alright today.    Currently in Pain? Yes   Pain Score 4    Pain Location Back   Pain Orientation Lower   Pain Descriptors / Indicators --  stiff   Aggravating Factors  frequent bending    Pain Relieving Factors good body mechanics             [facility] PT Assessment - 09/22/16 0001      Observation/Other Assessments   Focus on Therapeutic Outcomes (FOTO)  43% limited improved from 58 % limited      AROM   Lumbar Flexion 75  can touch toes    Lumbar Extension 32   Lumbar - Right Side Bend 26   Lumbar - Left Side Bend 30     Strength   Overall Strength Comments WNL     Flexibility   Hamstrings 60 degrees bilateral                      [facility] Adult PT Treatment/Exercise - 09/22/16 0001      Therapeutic Activites    Lifting 50# x 10, 75# x 2 knee to waist leifting box, cues for neutral spine and abdominal contraction    Work Simulation pushing 220# in wheel chair with cues to engage core with turning and starting off      Lumbar Exercises: Stretches   Lower Trunk Rotation Limitations 5 x each    Standing Extension 10 seconds;2 reps   Quadruped Mid Back Stretch 1 rep;60 seconds     Lumbar  Exercises: Aerobic   Stationary Bike 6 minutes      Lumbar Exercises: Supine   Bridge 10 reps     Rec Bike L3 X 6 minutes              PT Short Term Goals - 08/23/16 0749      PT SHORT TERM GOAL #1   Title He will be independent with inital HEP.    Time 3   Period Weeks   Status Achieved     PT SHORT TERM GOAL #2   Title He will report pain decreased 40% or more   Time 3   Period Weeks   Status Achieved     PT SHORT TERM GOAL #3   Title He will report able to sit in more types of chairs with less pain.    Baseline Yes   Time 3   Period Weeks   Status Achieved     PT SHORT TERM GOAL #4   Title He will be able to stand for job for 30 min  with 3-4 max pain   Time 3   Period Weeks   Status Achieved           PT Long Term Goals - 09/22/16 1205      PT LONG TERM GOAL #1   Title He will be independent with all hEP issued   Status Achieved     PT LONG TERM GOAL #2   Title He will report pain as intermittant inlower back and able to sit as needed for activity   Baseline depends on activity, intermittent, able to sit as needed, stiff when he gets up   Time 6   Period Weeks   Status Achieved     PT LONG TERM GOAL #3   Title He will begin light sports activity   Baseline able to ride his bike, has not returned to basketball    Time 6   Period Weeks   Status Partially Met     PT LONG TERM GOAL #4   Title He will be able to bend to tye shoes and other activity in standing .    Baseline can do it in standing if he puts knee up on stool or he can kneel.    Time 6   Period Weeks   Status Partially Met     PT LONG TERM GOAL #5   Title He will report 1-2 max pain with work tasks    Baseline up to 7/10 with work    Time 6   Period Weeks   Status Not Met     PT LONG TERM GOAL #6   Title He will be able to push 200+ pounds for work tasks pushing patients. 1-2 max pain   Baseline able to push 200  in wheel chairs without pain.    Time 6   Period Weeks    Status Achieved     PT LONG TERM GOAL #7   Title He will be able to lift 50 pounds floor to waist to progress toward full duty workl   Baseline able to lift knee to waist 75# without increased back pain. He has a 75# lifting restriction    Time 6   Period Weeks   Status Partially Met               Plan - 09/22/16 1233    Clinical Impression Statement Mr. Acree is back to work full duty however has a 75# to 100# lifting restriction. 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 He does have to push patients when rolling them and bend down alot to adjust calf straps. With repetitive bending and pushing, he reports pain can still reach 8/10 at work. He reports no increased pain with sitting and standing. He does note stiffness in low back when rising after long term sitting or positioning. He cannot bend over and tie his shoes however he can put his foot on a stool or hold his leg up and tie his shoes. He has returned to riding his bicycle however has not returned to playing basketball. He can lift 75# from knee to waist level without increased pain. He can push wheel chairs without 200+ patients without increase pain.  He is minful of his body mechanics and engages core as appropriate. He has met or partially met most of his LTGs. He will have kidney surgery in 2 weeks and plans to cotinue HEP until then and ask for MD  clearance to resume HEP afterward.    PT Next Visit Plan discharge today.    PT Home Exercise Plan LTR , PPT, cat/camel, prayer stretch, posture education, seated pelvic tilt, Supine table top with heel  taps    Consulted and Agree with Plan of Care Patient      Patient will benefit from skilled therapeutic intervention in order to improve the following deficits and impairments:  Pain, Decreased activity tolerance, Decreased range of motion, Increased muscle spasms  Visit Diagnosis: Acute low back pain, unspecified back pain  laterality, with sciatica presence unspecified  Cramp and spasm     Problem List There are no active problems to display for this patient.   Dorene Ar, [place] 09/22/2016, 12:51 PM  [facility] [address] [place], [place], [address] Phone: [phone]   Fax:  [phone]  Name: Jeray Shugart MRN: [id number] Date of Birth: 16-May-1970  PHYSICAL THERAPY DISCHARGE SUMMARY  Visits from Start of Care: 10  Current functional level related to goals / functional outcomes: He made some progress but stopped coming so not sure where he is at functionally   Remaining deficits: See above. Unsure as he stopped coming to PT   Education / Equipment: HEP Plan: Patient agrees to discharge.  Patient goals were partially met. Patient is being discharged due to not returning since the last visit.  ?????    Noralee Stain  PT  11/01/16   1230 PM

## 2016-09-23 ENCOUNTER — Encounter (HOSPITAL_COMMUNITY): Payer: Self-pay

## 2016-09-23 ENCOUNTER — Encounter (HOSPITAL_COMMUNITY)
Admission: RE | Admit: 2016-09-23 | Discharge: 2016-09-23 | Disposition: A | Discharge status: Home / Self Care | Payer: Managed Care, Other (non HMO) | Source: Ambulatory Visit | Attending: Urology | Admitting: Urology

## 2016-09-23 DIAGNOSIS — Z01812 Encounter for preprocedural laboratory examination: Secondary | ICD-10-CM | POA: Insufficient documentation

## 2016-09-23 DIAGNOSIS — Z01810 Encounter for preprocedural cardiovascular examination: Secondary | ICD-10-CM | POA: Diagnosis not present

## 2016-09-23 DIAGNOSIS — I10 Essential (primary) hypertension: Secondary | ICD-10-CM | POA: Diagnosis not present

## 2016-09-23 HISTORY — DX: Chronic kidney disease, unspecified: N18.9

## 2016-09-23 HISTORY — DX: Essential (primary) hypertension: I10

## 2016-09-23 LAB — COMPREHENSIVE METABOLIC PANEL
ALBUMIN: 3.8 g/dL (ref 3.5–5.0)
ALT: 29 U/L (ref 17–63)
AST: 25 U/L (ref 15–41)
Alkaline Phosphatase: 40 U/L (ref 38–126)
Anion gap: 7 (ref 5–15)
BUN: 13 mg/dL (ref 6–20)
CHLORIDE: 107 mmol/L (ref 101–111)
CO2: 27 mmol/L (ref 22–32)
Calcium: 9.3 mg/dL (ref 8.9–10.3)
Creatinine, Ser: 1.3 mg/dL — ABNORMAL HIGH (ref 0.61–1.24)
GFR calc Af Amer: >60 mL/min (ref >60)
GFR calc non Af Amer: >60 mL/min (ref >60)
GLUCOSE: 86 mg/dL (ref 65–99)
POTASSIUM: 3.9 mmol/L (ref 3.5–5.1)
SODIUM: 141 mmol/L (ref 135–145)
Total Bilirubin: 0.6 mg/dL (ref 0.3–1.2)
Total Protein: 6.5 g/dL (ref 6.5–8.1)

## 2016-09-23 LAB — CBC
HCT: 41.3 % (ref 39.0–52.0)
Hemoglobin: 13.6 g/dL (ref 13.0–17.0)
MCH: 30.2 pg (ref 26.0–34.0)
MCHC: 32.9 g/dL (ref 30.0–36.0)
MCV: 91.6 fL (ref 78.0–100.0)
PLATELETS: 307 10*3/uL (ref 150–400)
RBC: 4.51 MIL/uL (ref 4.22–5.81)
RDW: 13.2 % (ref 11.5–15.5)
WBC: 7.5 10*3/uL (ref 4.0–10.5)

## 2016-09-23 LAB — ABO/RH: ABO/RH(D): A POS

## 2016-09-27 ENCOUNTER — Encounter: Payer: Managed Care, Other (non HMO) | Admitting: Physical Therapy

## 2016-09-28 NOTE — Anesthesia Preprocedure Evaluation (Addendum)
Anesthesia Evaluation  Patient identified by MRN, date of birth, ID band Patient awake    Reviewed: Allergy & Precautions, NPO status , Patient's Chart, lab work & pertinent test results  Airway Mallampati: I  TM Distance: >3 FB Neck ROM: Full    Dental  (+) Poor Dentition, Loose, Dental Advisory Given,    Pulmonary neg pulmonary ROS, Current Smoker,    Pulmonary exam normal breath sounds clear to auscultation       Cardiovascular hypertension, negative cardio ROS Normal cardiovascular exam Rhythm:Regular Rate:Normal     Neuro/Psych negative neurological ROS  negative psych ROS   GI/Hepatic negative GI ROS, Neg liver ROS,   Endo/Other  negative endocrine ROS  Renal/GU Renal disease     Musculoskeletal negative musculoskeletal ROS (+)   Abdominal   Peds  Hematology negative hematology ROS (+)   Anesthesia Other Findings   Reproductive/Obstetrics                          Anesthesia Physical Anesthesia Plan  ASA: II  Anesthesia Plan: General   Post-op Pain Management:    Induction: Intravenous  PONV Risk Score and Plan: 2 and Ondansetron, Dexamethasone and Midazolam  Airway Management Planned: Oral ETT  Additional Equipment:   Intra-op Plan:   Post-operative Plan: Extubation in OR  Informed Consent: I have reviewed the patients History and Physical, chart, labs and discussed the procedure including the risks, benefits and alternatives for the proposed anesthesia with the patient or authorized representative who has indicated his/her understanding and acceptance.   Dental advisory given  Plan Discussed with: CRNA  Anesthesia Plan Comments: (2x PIV)        Anesthesia Quick Evaluation

## 2016-09-29 ENCOUNTER — Encounter (HOSPITAL_COMMUNITY)
Admission: RE | Disposition: A | Discharge status: Home / Self Care | Payer: Self-pay | Source: Ambulatory Visit | Attending: Urology

## 2016-09-29 ENCOUNTER — Encounter (HOSPITAL_COMMUNITY): Payer: Self-pay | Admitting: *Deleted

## 2016-09-29 ENCOUNTER — Inpatient Hospital Stay (HOSPITAL_COMMUNITY): Payer: Managed Care, Other (non HMO) | Admitting: Anesthesiology

## 2016-09-29 ENCOUNTER — Inpatient Hospital Stay (HOSPITAL_COMMUNITY)
Admission: RE | Admit: 2016-09-29 | Discharge: 2016-09-30 | DRG: 661 | Disposition: A | Discharge status: Home / Self Care | Payer: Managed Care, Other (non HMO) | Source: Ambulatory Visit | Attending: Urology | Admitting: Urology

## 2016-09-29 DIAGNOSIS — M545 Low back pain, unspecified: Secondary | ICD-10-CM

## 2016-09-29 DIAGNOSIS — I10 Essential (primary) hypertension: Secondary | ICD-10-CM | POA: Diagnosis present

## 2016-09-29 DIAGNOSIS — M5126 Other intervertebral disc displacement, lumbar region: Secondary | ICD-10-CM

## 2016-09-29 DIAGNOSIS — M5136 Other intervertebral disc degeneration, lumbar region: Secondary | ICD-10-CM

## 2016-09-29 DIAGNOSIS — Z79899 Other long term (current) drug therapy: Secondary | ICD-10-CM

## 2016-09-29 DIAGNOSIS — K219 Gastro-esophageal reflux disease without esophagitis: Secondary | ICD-10-CM | POA: Diagnosis present

## 2016-09-29 DIAGNOSIS — F17200 Nicotine dependence, unspecified, uncomplicated: Secondary | ICD-10-CM | POA: Diagnosis present

## 2016-09-29 DIAGNOSIS — S335XXA Sprain of ligaments of lumbar spine, initial encounter: Secondary | ICD-10-CM

## 2016-09-29 DIAGNOSIS — N2889 Other specified disorders of kidney and ureter: Secondary | ICD-10-CM | POA: Diagnosis present

## 2016-09-29 HISTORY — PX: LAPAROSCOPIC NEPHRECTOMY: SHX1930

## 2016-09-29 LAB — TYPE AND SCREEN
ABO/RH(D): A POS
Antibody Screen: NEGATIVE

## 2016-09-29 LAB — HEMOGLOBIN AND HEMATOCRIT, BLOOD
HCT: 40.1 % (ref 39.0–52.0)
Hemoglobin: 13.3 g/dL (ref 13.0–17.0)

## 2016-09-29 SURGERY — NEPHRECTOMY, RADICAL, LAPAROSCOPIC, ADULT
Anesthesia: General | Laterality: Right

## 2016-09-29 MED ORDER — HYDROMORPHONE HCL-NACL 0.5-0.9 MG/ML-% IV SOSY
PREFILLED_SYRINGE | INTRAVENOUS | Status: AC
  Administered 2016-09-29: 0.5 mg via INTRAVENOUS
  Filled 2016-09-29: qty 3

## 2016-09-29 MED ORDER — ACETAMINOPHEN 10 MG/ML IV SOLN
INTRAVENOUS | Status: AC
  Filled 2016-09-29: qty 100

## 2016-09-29 MED ORDER — EPHEDRINE 5 MG/ML INJ
INTRAVENOUS | Status: AC
  Filled 2016-09-29: qty 10

## 2016-09-29 MED ORDER — BUPIVACAINE LIPOSOME 1.3 % IJ SUSP
20.0000 mL | Freq: Once | INTRAMUSCULAR | Status: AC
Start: 2016-09-29 — End: 2016-09-29
  Administered 2016-09-29: 20 mL
  Filled 2016-09-29: qty 20

## 2016-09-29 MED ORDER — ONDANSETRON HCL 4 MG/2ML IJ SOLN
INTRAMUSCULAR | Status: DC | PRN
  Administered 2016-09-29: 4 mg via INTRAVENOUS

## 2016-09-29 MED ORDER — PRAVASTATIN SODIUM 20 MG PO TABS
20.0000 mg | ORAL_TABLET | Freq: Every day | ORAL | Status: DC
  Administered 2016-09-29 – 2016-09-30 (×2): 20 mg via ORAL
  Filled 2016-09-29 (×2): qty 1

## 2016-09-29 MED ORDER — DEXTROSE-NACL 5-0.45 % IV SOLN
INTRAVENOUS | Status: DC
  Administered 2016-09-29 – 2016-09-30 (×2): via INTRAVENOUS

## 2016-09-29 MED ORDER — CEFAZOLIN SODIUM-DEXTROSE 2-4 GM/100ML-% IV SOLN
INTRAVENOUS | Status: AC
  Filled 2016-09-29: qty 100

## 2016-09-29 MED ORDER — PHENYLEPHRINE 40 MCG/ML (10ML) SYRINGE FOR IV PUSH (FOR BLOOD PRESSURE SUPPORT)
PREFILLED_SYRINGE | INTRAVENOUS | Status: AC
Start: 2016-09-29 — End: 2016-09-29
  Filled 2016-09-29: qty 10

## 2016-09-29 MED ORDER — SODIUM CHLORIDE 0.9 % IJ SOLN
INTRAMUSCULAR | Status: AC
  Filled 2016-09-29: qty 20

## 2016-09-29 MED ORDER — BELLADONNA-OPIUM 16.2-30 MG RE SUPP: 1.0000 | Freq: Four times a day (QID) | RECTAL | Status: DC | PRN

## 2016-09-29 MED ORDER — FENTANYL CITRATE (PF) 250 MCG/5ML IJ SOLN
INTRAMUSCULAR | Status: AC
  Filled 2016-09-29: qty 5

## 2016-09-29 MED ORDER — PROPOFOL 10 MG/ML IV BOLUS
INTRAVENOUS | Status: DC | PRN
  Administered 2016-09-29: 200 mg via INTRAVENOUS

## 2016-09-29 MED ORDER — MORPHINE SULFATE (PF) 4 MG/ML IV SOLN: 2.0000 mg | INTRAVENOUS | Status: DC | PRN

## 2016-09-29 MED ORDER — LIDOCAINE 2% (20 MG/ML) 5 ML SYRINGE
INTRAMUSCULAR | Status: AC
  Filled 2016-09-29: qty 5

## 2016-09-29 MED ORDER — MIDAZOLAM HCL 2 MG/2ML IJ SOLN
INTRAMUSCULAR | Status: AC
  Filled 2016-09-29: qty 2

## 2016-09-29 MED ORDER — HYDROCHLOROTHIAZIDE 25 MG PO TABS
12.5000 mg | ORAL_TABLET | Freq: Every day | ORAL | Status: DC
  Administered 2016-09-29 – 2016-09-30 (×2): 12.5 mg via ORAL
  Filled 2016-09-29 (×2): qty 1

## 2016-09-29 MED ORDER — FENTANYL CITRATE (PF) 100 MCG/2ML IJ SOLN
INTRAMUSCULAR | Status: DC | PRN
  Administered 2016-09-29 (×5): 50 ug via INTRAVENOUS
  Administered 2016-09-29: 100 ug via INTRAVENOUS
  Administered 2016-09-29 (×3): 50 ug via INTRAVENOUS

## 2016-09-29 MED ORDER — LIDOCAINE HCL (CARDIAC) 20 MG/ML IV SOLN
INTRAVENOUS | Status: DC | PRN
  Administered 2016-09-29: 60 mg via INTRAVENOUS

## 2016-09-29 MED ORDER — LACTATED RINGERS IV SOLN
INTRAVENOUS | Status: DC | PRN
  Administered 2016-09-29 (×2): via INTRAVENOUS

## 2016-09-29 MED ORDER — PROMETHAZINE HCL 25 MG/ML IJ SOLN: 6.2500 mg | INTRAMUSCULAR | Status: DC | PRN

## 2016-09-29 MED ORDER — FAMOTIDINE 10 MG PO TABS
10.0000 mg | ORAL_TABLET | Freq: Every day | ORAL | Status: DC
  Administered 2016-09-29 – 2016-09-30 (×2): 10 mg via ORAL
  Filled 2016-09-29 (×2): qty 1

## 2016-09-29 MED ORDER — PHENYLEPHRINE 40 MCG/ML (10ML) SYRINGE FOR IV PUSH (FOR BLOOD PRESSURE SUPPORT)
PREFILLED_SYRINGE | INTRAVENOUS | Status: AC
  Filled 2016-09-29: qty 10

## 2016-09-29 MED ORDER — MIDAZOLAM HCL 5 MG/5ML IJ SOLN
INTRAMUSCULAR | Status: DC | PRN
  Administered 2016-09-29 (×2): 2 mg via INTRAVENOUS

## 2016-09-29 MED ORDER — DIPHENHYDRAMINE HCL 50 MG/ML IJ SOLN: 12.5000 mg | Freq: Four times a day (QID) | INTRAMUSCULAR | Status: DC | PRN

## 2016-09-29 MED ORDER — KETOROLAC TROMETHAMINE 15 MG/ML IJ SOLN
15.0000 mg | Freq: Four times a day (QID) | INTRAMUSCULAR | Status: DC
  Administered 2016-09-29 – 2016-09-30 (×2): 15 mg via INTRAVENOUS
  Filled 2016-09-29 (×2): qty 1

## 2016-09-29 MED ORDER — ROCURONIUM BROMIDE 100 MG/10ML IV SOLN
INTRAVENOUS | Status: DC | PRN
  Administered 2016-09-29: 50 mg via INTRAVENOUS
  Administered 2016-09-29 (×2): 20 mg via INTRAVENOUS

## 2016-09-29 MED ORDER — ONDANSETRON HCL 4 MG/2ML IJ SOLN: 4.0000 mg | INTRAMUSCULAR | Status: DC | PRN

## 2016-09-29 MED ORDER — DIPHENHYDRAMINE HCL 12.5 MG/5ML PO ELIX
12.5000 mg | ORAL_SOLUTION | Freq: Four times a day (QID) | ORAL | Status: DC | PRN
  Filled 2016-09-29: qty 10

## 2016-09-29 MED ORDER — SODIUM CHLORIDE 0.9 % IJ SOLN
INTRAMUSCULAR | Status: DC | PRN
Start: 2016-09-29 — End: 2016-09-29
  Administered 2016-09-29: 40 mL

## 2016-09-29 MED ORDER — MEPERIDINE HCL 50 MG/ML IJ SOLN: 6.2500 mg | INTRAMUSCULAR | Status: DC | PRN

## 2016-09-29 MED ORDER — SUCCINYLCHOLINE CHLORIDE 200 MG/10ML IV SOSY
PREFILLED_SYRINGE | INTRAVENOUS | Status: AC
  Filled 2016-09-29: qty 10

## 2016-09-29 MED ORDER — ONDANSETRON HCL 4 MG/2ML IJ SOLN
INTRAMUSCULAR | Status: AC
  Filled 2016-09-29: qty 2

## 2016-09-29 MED ORDER — BUPIVACAINE-EPINEPHRINE (PF) 0.5% -1:200000 IJ SOLN
INTRAMUSCULAR | Status: AC
  Filled 2016-09-29: qty 30

## 2016-09-29 MED ORDER — DIPHENHYDRAMINE HCL 50 MG/ML IJ SOLN
INTRAMUSCULAR | Status: AC
  Filled 2016-09-29: qty 1

## 2016-09-29 MED ORDER — TRAMADOL HCL 50 MG PO TABS
50.0000 mg | ORAL_TABLET | Freq: Four times a day (QID) | ORAL | Status: DC | PRN
  Administered 2016-09-30: 50 mg via ORAL
  Filled 2016-09-29: qty 1

## 2016-09-29 MED ORDER — PHENYLEPHRINE 40 MCG/ML (10ML) SYRINGE FOR IV PUSH (FOR BLOOD PRESSURE SUPPORT)
PREFILLED_SYRINGE | INTRAVENOUS | Status: DC | PRN
  Administered 2016-09-29: 80 ug via INTRAVENOUS

## 2016-09-29 MED ORDER — HYDROMORPHONE HCL-NACL 0.5-0.9 MG/ML-% IV SOSY
0.2500 mg | PREFILLED_SYRINGE | INTRAVENOUS | Status: DC | PRN
  Administered 2016-09-29 (×4): 0.5 mg via INTRAVENOUS

## 2016-09-29 MED ORDER — MORPHINE SULFATE (PF) 2 MG/ML IV SOLN
2.0000 mg | INTRAVENOUS | Status: DC | PRN
  Administered 2016-09-29: 2 mg via INTRAVENOUS
  Administered 2016-09-29: 4 mg via INTRAVENOUS
  Administered 2016-09-29: 2 mg via INTRAVENOUS
  Filled 2016-09-29 (×2): qty 1
  Filled 2016-09-29: qty 2

## 2016-09-29 MED ORDER — BUPIVACAINE-EPINEPHRINE 0.5% -1:200000 IJ SOLN
INTRAMUSCULAR | Status: DC | PRN
  Administered 2016-09-29: 30 mL

## 2016-09-29 MED ORDER — DIPHENHYDRAMINE HCL 50 MG/ML IJ SOLN
12.5000 mg | Freq: Once | INTRAMUSCULAR | Status: AC
  Administered 2016-09-29: 12.5 mg via INTRAVENOUS

## 2016-09-29 MED ORDER — TRAMADOL HCL 50 MG PO TABS: 50.0000 mg | ORAL_TABLET | Freq: Four times a day (QID) | ORAL | 0 refills | Status: DC | PRN

## 2016-09-29 MED ORDER — SUGAMMADEX SODIUM 200 MG/2ML IV SOLN
INTRAVENOUS | Status: DC | PRN
  Administered 2016-09-29: 160 mg via INTRAVENOUS

## 2016-09-29 MED ORDER — ACETAMINOPHEN 10 MG/ML IV SOLN
1000.0000 mg | Freq: Four times a day (QID) | INTRAVENOUS | Status: AC
  Administered 2016-09-29 – 2016-09-30 (×4): 1000 mg via INTRAVENOUS
  Filled 2016-09-29 (×4): qty 100

## 2016-09-29 MED ORDER — LACTATED RINGERS IR SOLN
Status: DC | PRN
  Administered 2016-09-29: 1000 mL

## 2016-09-29 MED ORDER — SODIUM CHLORIDE 0.9 % IJ SOLN
INTRAMUSCULAR | Status: AC
  Filled 2016-09-29: qty 50

## 2016-09-29 MED ORDER — HYDRALAZINE HCL 20 MG/ML IJ SOLN: 5.0000 mg | INTRAMUSCULAR | Status: DC | PRN

## 2016-09-29 MED ORDER — HYDROMORPHONE HCL-NACL 0.5-0.9 MG/ML-% IV SOSY
PREFILLED_SYRINGE | INTRAVENOUS | Status: AC
  Administered 2016-09-29: 0.5 mg via INTRAVENOUS
  Filled 2016-09-29: qty 1

## 2016-09-29 MED ORDER — PROPOFOL 10 MG/ML IV BOLUS
INTRAVENOUS | Status: AC
  Filled 2016-09-29: qty 40

## 2016-09-29 MED ORDER — CEFAZOLIN SODIUM-DEXTROSE 2-4 GM/100ML-% IV SOLN
2.0000 g | INTRAVENOUS | Status: AC
  Administered 2016-09-29: 2 g via INTRAVENOUS

## 2016-09-29 MED ORDER — DEXAMETHASONE SODIUM PHOSPHATE 10 MG/ML IJ SOLN
INTRAMUSCULAR | Status: DC | PRN
  Administered 2016-09-29: 10 mg via INTRAVENOUS

## 2016-09-29 SURGICAL SUPPLY — 54 items
APPLICATOR ARISTA FLEXITIP XL (MISCELLANEOUS) IMPLANT
APPLICATOR SURGIFLO ENDO (HEMOSTASIS) ×3 IMPLANT
APPLIER CLIP ROT 10 11.4 M/L (STAPLE)
BAG LAPAROSCOPIC 12 15 PORT 16 (BASKET) IMPLANT
BAG RETRIEVAL 12/15 (BASKET)
BAG RETRIEVAL 12/15MM (BASKET)
BAG ZIPLOCK 12X15 (MISCELLANEOUS) IMPLANT
BLADE EXTENDED COATED 6.5IN (ELECTRODE) IMPLANT
BLADE SURG SZ10 CARB STEEL (BLADE) ×3 IMPLANT
CHLORAPREP W/TINT 26ML (MISCELLANEOUS) ×3 IMPLANT
CLIP APPLIE ROT 10 11.4 M/L (STAPLE) IMPLANT
CLIP VESOLOCK LG 6/CT PURPLE (CLIP) ×3 IMPLANT
CLIP VESOLOCK MED LG 6/CT (CLIP) ×3 IMPLANT
CLIP VESOLOCK XL 6/CT (CLIP) ×3 IMPLANT
CUTTER FLEX LINEAR 45M (STAPLE) ×3 IMPLANT
DECANTER SPIKE VIAL GLASS SM (MISCELLANEOUS) IMPLANT
DERMABOND ADVANCED (GAUZE/BANDAGES/DRESSINGS) ×2
DERMABOND ADVANCED .7 DNX12 (GAUZE/BANDAGES/DRESSINGS) ×1 IMPLANT
DRAPE INCISE IOBAN 66X45 STRL (DRAPES) ×3 IMPLANT
DRAPE WARM FLUID 44X44 (DRAPE) IMPLANT
ELECT PENCIL ROCKER SW 15FT (MISCELLANEOUS) ×3 IMPLANT
ELECT REM PT RETURN 15FT ADLT (MISCELLANEOUS) ×3 IMPLANT
GLOVE BIOGEL M STRL SZ7.5 (GLOVE) ×3 IMPLANT
GOWN STRL REUS W/TWL LRG LVL3 (GOWN DISPOSABLE) ×6 IMPLANT
HEMOSTAT ARISTA ABSORB 3G PWDR (MISCELLANEOUS) IMPLANT
HEMOSTAT SURGICEL 4X8 (HEMOSTASIS) IMPLANT
IRRIG SUCT STRYKERFLOW 2 WTIP (MISCELLANEOUS) ×3
IRRIGATION SUCT STRKRFLW 2 WTP (MISCELLANEOUS) ×1 IMPLANT
KIT BASIN OR (CUSTOM PROCEDURE TRAY) ×3 IMPLANT
MANIFOLD NEPTUNE II (INSTRUMENTS) ×3 IMPLANT
NEEDLE SPNL 22GX3.5 QUINCKE BK (NEEDLE) ×3 IMPLANT
PAD POSITIONING PINK XL (MISCELLANEOUS) ×3 IMPLANT
POSITIONER SURGICAL ARM (MISCELLANEOUS) ×6 IMPLANT
RELOAD 45 VASCULAR/THIN (ENDOMECHANICALS) ×3 IMPLANT
RELOAD STAPLE TA45 3.5 REG BLU (ENDOMECHANICALS) IMPLANT
SCISSORS LAP 5X35 DISP (ENDOMECHANICALS) IMPLANT
SHEARS HARMONIC ACE PLUS 36CM (ENDOMECHANICALS) ×6 IMPLANT
SLEEVE XCEL OPT CAN 5 100 (ENDOMECHANICALS) ×6 IMPLANT
SPONGE LAP 4X18 X RAY DECT (DISPOSABLE) IMPLANT
SURGIFLO W/THROMBIN 8M KIT (HEMOSTASIS) ×3 IMPLANT
SUT MNCRL AB 4-0 PS2 18 (SUTURE) ×6 IMPLANT
SUT PDS AB 0 CT1 36 (SUTURE) ×3 IMPLANT
SUT VIC AB 2-0 CT1 27 (SUTURE)
SUT VIC AB 2-0 CT1 27XBRD (SUTURE) IMPLANT
SUT VICRYL 0 UR6 27IN ABS (SUTURE) ×3 IMPLANT
TAPE CLOTH 4X10 WHT NS (GAUZE/BANDAGES/DRESSINGS) IMPLANT
TOWEL OR 17X26 10 PK STRL BLUE (TOWEL DISPOSABLE) ×3 IMPLANT
TOWEL OR NON WOVEN STRL DISP B (DISPOSABLE) ×3 IMPLANT
TRAY FOLEY CATH 16FR SILVER (SET/KITS/TRAYS/PACK) ×3 IMPLANT
TRAY LAPAROSCOPIC (CUSTOM PROCEDURE TRAY) ×3 IMPLANT
TROCAR BLADELESS OPT 5 100 (ENDOMECHANICALS) ×3 IMPLANT
TROCAR UNIVERSAL OPT 12M 100M (ENDOMECHANICALS) ×3 IMPLANT
TROCAR XCEL 12X100 BLDLESS (ENDOMECHANICALS) ×3 IMPLANT
TUBING INSUF HEATED (TUBING) ×3 IMPLANT

## 2016-09-29 NOTE — Transfer of Care (Signed)
Immediate Anesthesia Transfer of Care Note  Patient: Dave Jimenez  Procedure(s) Performed: Procedure(s): LAPAROSCOPIC RADICAL  NEPHRECTOMY RIGHT (Right)  Patient Location: PACU  Anesthesia Type:General  Level of Consciousness: awake, oriented and patient cooperative  Airway & Oxygen Therapy: Patient Spontanous Breathing  Post-op Assessment: Report given to RN, Post -op Vital signs reviewed and stable and Patient moving all extremities X 4  Post vital signs: Reviewed and stable  Last Vitals:  Vitals:   09/29/16 0636 09/29/16 0659  BP: (!) 155/107 (!) 164/110  Pulse: 81   Resp: 18   Temp: 37.1 C   SpO2: 100%     Last Pain:  Vitals:   09/29/16 0646  TempSrc:   PainSc: 2       Patients Stated Pain Goal: 3 (09/29/16 0646)  Complications: No apparent anesthesia complications

## 2016-09-29 NOTE — H&P (Signed)
Renal Mass  HPI: Dave MelterSidney Jimenez is a 46 year-old male patient who was referred by Dr. Kerrin ChampagneJames E. Nitka, MD who is here further eval and management of a renal mass.  The mass is on the right side.   The lesion(s) was first noted on 06/23/2016. The mass was seen on MRI Scan.   He has had no symptoms. He has not seen blood in his urine. He does have a good appetite. He is not having pain in new locations. He has not recently had unwanted weight loss.   He has not had previous abdominal surgery. The patient can walk a flight of steps.   The patient denies history of diabetes, heart attack or stroke. There is not a a family history of kidney cancer. There is no family history of brain tumors (AMLs), seizures or brain aneurysm's.   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   The tumor was found incidentally as a workup for evaluation for his back pain. His back pain is slowly getting better. A dedicated renal mass protocol MRI revealed a contrast-enhancing lesion in the mid pole of the right kidney.     ALLERGIES: None   MEDICATIONS: Robaxin 500 mg tablet  Tramadol Hcl  Vitamin B-12 5,000 mcg-100 mcg tablet, sublingual     GU PSH: None   NON-GU PSH: Mastectomy, Simple, Complete    GU PMH: None   NON-GU PMH: Arthritis GERD Hypertension    FAMILY HISTORY: No Family History    SOCIAL HISTORY: Marital Status: Single Preferred Language: English; Race: Black or African American Current Smoking Status: Patient smokes occasionally.   Tobacco Use Assessment Completed: Used Tobacco in last 30 days? Does drink.  Does not drink caffeine. 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    REVIEW OF SYSTEMS:    GU Review Male:   Patient reports get up at night to urinate. Patient denies burning/ pain with urination, trouble starting your stream, stream starts and stops, have to strain to urinate , penile pain, hard to postpone urination,  leakage of urine, frequent urination, and erection problems.  Gastrointestinal (Upper):   Patient denies nausea, vomiting, and indigestion/ heartburn.  Gastrointestinal (Lower):   Patient denies diarrhea and constipation.  Constitutional:   Patient denies fever, night sweats, weight loss, and fatigue.  Skin:   Patient reports itching. Patient denies skin rash/ lesion.  Eyes:   Patient reports blurred vision. Patient denies double vision.  Ears/ Nose/ Throat:   Patient denies sore throat and sinus problems.  Hematologic/Lymphatic:   Patient denies swollen glands and easy bruising.  Cardiovascular:   Patient denies leg swelling and chest pains.  Respiratory:   Patient denies cough and shortness of breath.  Endocrine:   Patient denies excessive thirst.  Musculoskeletal:   Patient reports back pain. Patient denies joint pain.  Neurological:   Patient denies headaches and dizziness.  Psychologic:   Patient denies depression and anxiety.   VITAL SIGNS:      08/09/2016 09:35 AM  Weight 176 lb / 79.83 kg  Height 67.5 in / 171.45 cm  BP 130/86 mmHg  Pulse 73 /min  BMI 27.2 kg/m   MULTI-SYSTEM PHYSICAL EXAMINATION:    Constitutional: Well-nourished. No physical deformities. Normally developed. Good grooming.  Neck: Neck symmetrical, not swollen. Normal tracheal position.  Respiratory: No labored breathing, no use of accessory muscles. Clear to auscultation bilaterally  Cardiovascular: Normal temperature, normal extremity pulses, no swelling, no varicosities. Regular rate and rhythm  Lymphatic: No enlargement of neck, axillae, groin.  Skin: No paleness, no jaundice, no cyanosis. No lesion, no ulcer, no rash.  Neurologic / Psychiatric: Oriented to time, oriented to place, oriented to person. No depression, no anxiety, no agitation.  Gastrointestinal: No mass, no tenderness, no rigidity, non obese abdomen.  Eyes: Normal conjunctivae. Normal eyelids.  Ears, Nose, Mouth, and Throat: Left ear no  scars, no lesions, no masses. Right ear no scars, no lesions, no masses. Nose no scars, no lesions, no masses. Normal hearing. Normal lips.  Musculoskeletal: Normal gait and station of head and neck.     PAST DATA REVIEWED:  Source Of History:  Patient  Records Review:   Previous Patient Records  X-Ray Review: MRI Abdomen: Reviewed Films.     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 I went over this operation in detail with the patient including the risks and benefits. We discussed port position and I outlined the position of the 4 planned trocars. I also explained to them that they will need extraction incision which typically is in the lower quadrant, connecting to the two lower lateral incisions. I discussed the actual surgery with them and we went over the various structures that are in intimate association with the kidney and the risk of damage thereof. I outlined the risk of injury to the major nerves and vessels in proximity to the kidney. I explained the patient the expected hospital course. I told them that they should plan to be in the hospital at least 2-3 days. Further, I told them that they would likely need approximately 1 month to fully recover. Given the severity of this planned operation, we will have the patient be seen by their primary care doctor and cleared for surgery. We will plan to schedule this as soon as possible.         Notes:   I discussed with the patient is options of surveillance, biopsy, or surgical extirpation. Unfortunately, the endophytic nature of the mass is not amenable to partial nephrectomy. Ultimately, we decided on radical nephrectomy. Fortunately, his contralateral kidney is in good shape  and should be fine for him moving forward without any risk or need for dialysis.

## 2016-09-29 NOTE — Op Note (Signed)
Preoperative diagnosis:  1. Right renal mass   Postoperative diagnosis:  1. same   Procedure: 1. Laparoscopic right radical nephrectomy  Surgeon: Crist FatBenjamin W. Chrstopher Malenfant, MD First Assistant: Harrie ForemanAmanda Dancy, PA-C  Anesthesia: General  Complications: None  Intraoperative findings: two renal arteries, one vein.  Adrenal gland spared.  EBL: 50cc  Specimens:  Right kidney and proximal ureter  Indication: Dave MelterSidney Jimenez is a 46 y.o. patient with endophytic central renal mass.  After reviewing the management options for treatment, he elected to proceed with the above surgical procedure(s). We have discussed the potential benefits and risks of the procedure, side effects of the proposed treatment, the likelihood of the patient achieving the goals of the procedure, and any potential problems that might occur during the procedure or recuperation. Informed consent has been obtained.  Description of procedure:  An assistant was required for this surgical procedure.  The duties of the assistant included but were not limited to suctioning, passing suture, camera manipulation, retraction. This procedure would not be able to be performed without an Geophysicist/field seismologistassistant.  A site was selected lateral to the umbilicus for placement of the camera port. This was placed using a standard open Hassan technique which allowed entry into the peritoneal cavity under direct vision and without difficulty. A 12 mm Hassan cannula was placed and a pneumoperitoneum established. The camera was then used to inspect the abdomen and there was no evidence of any intra-abdominal injuries or other abnormalities. The remaining abdominal ports were then placed under visual guidance.  A second 12 mm port was placed in the right lower quadrant approximately 8 cm away from the camera. A 5 mm port was placed in the right upper quadrant again 8 cm away from the camera. A second 5 mm port was placed just inferior to the xiphoid process in the midline,  this was used as a liver retractor.  An additional 5 mm port was then placed in the right lower quadrant at the anterior axillary line lateral to the 12 mm port. All ports were placed under direct vision without difficulty.   The white line of Toldt was incised allowing the colon to be mobilized medially and the plane between the mesocolon and the anterior layer of Gerota's fascia to be developed and the kidney exposed. The ureter and gonadal vein were identified inferiorly and the ureter was lifted anteriorly off the psoas muscle. Dissection proceeded superiorly along the gonadal vein until the renal vein was identified. The renal hilum was then carefully isolated with a combination of blunt and sharp dissectiong allowing the renal arterial and venous structures to be separated and isolated.   The both renal arteries were isolated and ligated using large laparoscopic clips, 2 clips down and one clip up. The artery was then cut using the Leopard scopic scissors. The renal vein was then isolated and also ligated and divided with a 45 mm Flex ETS stapler.   Gerota's fascia was intentionally entered superiorly and the space between the adrenal gland and the kidney was developed allowing the adrenal gland to be spared. The hepatorenal ligaments were divided.. The lateral and posterior attachements to the kidney were then divided. Once the kidney was free from its attachments  40cc of 0.25% rupivocaine was then injected into the right anterior axillary line b/w the iliac crest and the twelfth rib under laparoscopic guidance. The layer between the tranversus abdominus and the internal oblique was targeted.   The kidney/ureter specimen was then placed into a 12 mm Endocatch  II retrieval bag. The renal hilum, liver, adrenal bed and gonadal vein areas were each inspected and hemostasis was ensured with the pneomperitoneal pressures lowered. Surgicel was then placed over the hilum.  The camera was then brought to the  12 mm port laterally and the camera port was removed and the fascia closed using a Leverne Humbles needle with a 0 Vicryl.  All the ports were then removed under visual guidance. The lateral 12 mm port and 5 mm port were then connected sharply with a 15 blade. Then opened this incision down to the external oblique fascia. We then spread the muscle fibers down to the internal oblique fascia which we then opened with cautery. These were then spread in all muscle spared and the posterior peritoneum was opened. The rectus muscle was pulled medially. The specimen was then removed through these incision. The internal oblique fascia was then closed with a 0 Vicryl in a running fashion. The external oblique fascia was then closed with a 0 PDS in a running fashion.  All incisions were injected with local anesthetic and reapproximated at the skin with 4-0 monocryl sutures. Dermabond was applied to the skin. The patient tolerated the procedure well and without complications and was transferred to the recovery unit in satisfactory condition.   Crist Fat, M.D.

## 2016-09-29 NOTE — Anesthesia Postprocedure Evaluation (Signed)
Anesthesia Post Note  Patient: Dave Jimenez  Procedure(s) Performed: Procedure(s) (LRB): LAPAROSCOPIC RADICAL  NEPHRECTOMY RIGHT (Right)     Patient location during evaluation: PACU Anesthesia Type: General Level of consciousness: sedated and patient cooperative Pain management: pain level controlled Vital Signs Assessment: post-procedure vital signs reviewed and stable Respiratory status: spontaneous breathing Cardiovascular status: stable Anesthetic complications: no    Last Vitals:  Vitals:   09/29/16 1313 09/29/16 1523  BP: (!) 175/104 (!) 150/94  Pulse: 63   Resp: 14   Temp:    SpO2: 100%     Last Pain:  Vitals:   09/29/16 1642  TempSrc:   PainSc: Asleep                 Lewie LoronJohn Kechia Yahnke

## 2016-09-29 NOTE — Anesthesia Procedure Notes (Signed)
Procedure Name: Intubation Date/Time: 09/29/2016 8:49 AM Performed by: Willeen Cass P Pre-anesthesia Checklist: Patient identified, Emergency Drugs available, Suction available and Patient being monitored Patient Re-evaluated:Patient Re-evaluated prior to induction Oxygen Delivery Method: Circle System Utilized Preoxygenation: Pre-oxygenation with 100% oxygen Induction Type: IV induction Ventilation: Mask ventilation without difficulty Laryngoscope Size: Mac and 4 Grade View: Grade II Tube type: Oral Number of attempts: 1 Airway Equipment and Method: Stylet Placement Confirmation: ETT inserted through vocal cords under direct vision,  positive ETCO2 and breath sounds checked- equal and bilateral Secured at: 23 cm Tube secured with: Tape Dental Injury: Teeth and Oropharynx as per pre-operative assessment

## 2016-09-29 NOTE — Discharge Instructions (Signed)

## 2016-09-30 LAB — BASIC METABOLIC PANEL
ANION GAP: 5 (ref 5–15)
BUN: 11 mg/dL (ref 6–20)
CO2: 28 mmol/L (ref 22–32)
Calcium: 8.7 mg/dL — ABNORMAL LOW (ref 8.9–10.3)
Chloride: 106 mmol/L (ref 101–111)
Creatinine, Ser: 1.71 mg/dL — ABNORMAL HIGH (ref 0.61–1.24)
GFR calc non Af Amer: 46 mL/min — ABNORMAL LOW (ref >60)
GFR, EST AFRICAN AMERICAN: 53 mL/min — AB (ref >60)
Glucose, Bld: 95 mg/dL (ref 65–99)
POTASSIUM: 3.8 mmol/L (ref 3.5–5.1)
Sodium: 139 mmol/L (ref 135–145)

## 2016-09-30 LAB — HEMOGLOBIN AND HEMATOCRIT, BLOOD
HEMATOCRIT: 38.8 % — AB (ref 39.0–52.0)
HEMOGLOBIN: 12.8 g/dL — AB (ref 13.0–17.0)

## 2016-09-30 MED ORDER — ACETAMINOPHEN 325 MG PO TABS
650.0000 mg | ORAL_TABLET | Freq: Four times a day (QID) | ORAL | Status: DC | PRN
  Administered 2016-09-30: 650 mg via ORAL
  Filled 2016-09-30: qty 2

## 2016-09-30 NOTE — Progress Notes (Signed)
Discharged tohome, in stable condition. Instructions reviewed with pt . Acknowledge understanding. SRP, RN

## 2016-09-30 NOTE — Discharge Summary (Signed)
Date of admission: 09/29/2016  Date of discharge: 09/30/2016   Admission diagnosis: right renal mass  Discharge diagnosis: same  Secondary diagnoses:  Patient Active Problem List   Diagnosis Date Noted  . Renal mass 09/29/2016    Procedures performed: Procedure(s): LAPAROSCOPIC RADICAL  NEPHRECTOMY RIGHT  History and Physical: For full details, please see admission history and physical. Briefly, Dave Jimenez is a 46 y.o. year old patient with central/endophytic right renal mass that was suspicious for cancer.   Hospital Course: Patient tolerated the procedure well.  He was then transferred to the floor after an uneventful PACU stay.  His hospital course was uncomplicated.  On POD#1 he had met discharge criteria: was eating a regular diet, was up and ambulating independently,  pain was well controlled, was voiding without a catheter, and was ready to for discharge.  PE Vitals:   09/29/16 1523 09/29/16 2032 09/30/16 0005 09/30/16 0331  BP: (!) 150/94 (!) 130/97 128/73 (!) 142/86  Pulse:  62 63 62  Resp:  '14 16 14  ' Temp:  98.3 F (36.8 C) 98.7 F (37.1 C) 98.8 F (37.1 C)  TempSrc:  Oral Oral Oral  SpO2:  100% 100% 100%  Weight:      Height:       No acute distress Non-labored breathing Abdomen soft, appropriately tender Extremities symmetric  Laboratory values:   Recent Labs  09/29/16 1200 09/30/16 0405  HGB 13.3 12.8*  HCT 40.1 38.8*    Recent Labs  09/30/16 0405  NA 139  K 3.8  CL 106  CO2 28  GLUCOSE 95  BUN 11  CREATININE 1.71*  CALCIUM 8.7*   No results for input(s): LABPT, INR in the last 72 hours. No results for input(s): LABURIN in the last 72 hours. Results for orders placed or performed in visit on 08/12/11  Wound culture     Status: None   Collection Time: 08/12/11  7:58 PM  Result Value Ref Range Status   Culture Abundant STAPHYLOCOCCUS AUREUS  Final    Comment: WOUND   Gram Stain No WBC Seen  Final   Gram Stain No Squamous Epithelial  Cells Seen  Final   Gram Stain No Organisms Seen  Final   Organism ID, Bacteria STAPHYLOCOCCUS AUREUS  Final    Comment: Rifampin and Gentamicin should not be used as single drugs for treatment of Staph infections.      Susceptibility   Staphylococcus aureus -  (no method available)    PENICILLIN >=0.5 Resistant     OXACILLIN 0.5 Sensitive     CEFAZOLIN  Sensitive     GENTAMICIN <=0.5 Sensitive     CIPROFLOXACIN <=0.5 Sensitive     LEVOFLOXACIN <=0.12 Sensitive     MOXIFLOXACIN <=0.25 Sensitive     TRIMETH/SULFA <=10 Sensitive     VANCOMYCIN 1 Sensitive     CLINDAMYCIN <=0.25 Sensitive     ERYTHROMYCIN <=0.25 Sensitive     LINEZOLID 2 Sensitive     QUINUPRISTIN/DALFOPRISTIN <=0.25 Sensitive     RIFAMPIN <=0.5 Sensitive     TETRACYCLINE <=1 Sensitive     Disposition: Home  Discharge instruction: The patient was instructed to be ambulatory but told to refrain from heavy lifting, strenuous activity, or driving.   Discharge medications:  Allergies as of 09/30/2016   No Known Allergies     Medication List    STOP taking these medications   methylPREDNISolone 4 MG tablet Commonly known as:  MEDROL     TAKE these medications  acetaminophen 500 MG tablet Commonly known as:  TYLENOL Take 1 tablet (500 mg total) by mouth every 6 (six) hours as needed.   hydrochlorothiazide 25 MG tablet Commonly known as:  HYDRODIURIL Take 0.5 tablets (12.5 mg total) by mouth daily.   methocarbamol 500 MG tablet Commonly known as:  ROBAXIN Take 1 tablet (500 mg total) by mouth 3 (three) times daily. X 10 days then prn muscle spasm What changed:  when to take this  reasons to take this  additional instructions   pravastatin 20 MG tablet Commonly known as:  PRAVACHOL Take 1 tablet (20 mg total) by mouth daily.   ranitidine 75 MG tablet Commonly known as:  ZANTAC Take 75 mg by mouth daily as needed for heartburn. What changed:  Another medication with the same name was removed.  Continue taking this medication, and follow the directions you see here.   traMADol 50 MG tablet Commonly known as:  ULTRAM Take 1-2 tablets (50-100 mg total) by mouth every 6 (six) hours as needed for moderate pain. What changed:  how much to take            Discharge Care Instructions        Start     Ordered   09/29/16 0000  traMADol (ULTRAM) 50 MG tablet  Every 6 hours PRN    Question:  Supervising Provider  Answer:  Ardis Hughs   09/29/16 0840      Followup:  Follow-up Information    Ardis Hughs, MD Follow up on 10/12/2016.   Specialty:  Urology Why:  1pm Contact information: [place] [id number] [phone]

## 2016-10-08 ENCOUNTER — Ambulatory Visit: Payer: Managed Care, Other (non HMO) | Admitting: Family Medicine

## 2016-10-11 ENCOUNTER — Ambulatory Visit: Payer: Managed Care, Other (non HMO) | Admitting: Family Medicine

## 2016-10-22 ENCOUNTER — Ambulatory Visit: Payer: Managed Care, Other (non HMO) | Admitting: Family Medicine

## 2016-10-27 ENCOUNTER — Ambulatory Visit (INDEPENDENT_AMBULATORY_CARE_PROVIDER_SITE_OTHER): Payer: Managed Care, Other (non HMO) | Admitting: Specialist

## 2016-10-27 ENCOUNTER — Encounter (INDEPENDENT_AMBULATORY_CARE_PROVIDER_SITE_OTHER): Payer: Self-pay | Admitting: Specialist

## 2016-10-27 VITALS — BP 134/98 | HR 65 | Ht 67.5 in | Wt 176.0 lb

## 2016-10-27 DIAGNOSIS — Z905 Acquired absence of kidney: Secondary | ICD-10-CM | POA: Diagnosis not present

## 2016-10-27 DIAGNOSIS — M5136 Other intervertebral disc degeneration, lumbar region: Secondary | ICD-10-CM | POA: Insufficient documentation

## 2016-10-27 NOTE — Patient Instructions (Signed)
Avoid frequent bending and stooping  No lifting greater than 10 lbs. May use ice or moist heat for pain. Weight loss is of benefit. Handicap license is approved.   

## 2016-10-27 NOTE — Progress Notes (Signed)
Office Visit Note   Patient: Dave Jimenez           Date of Birth: 09-24-1970           MRN: [id number] Visit Date: 10/27/2016              Requested by: Lizbeth Bark, FNP [address] [place], [place] [address] PCP: Lizbeth Bark, FNP   Assessment & Plan: Visit Diagnoses:  1. DDD (degenerative disc disease), lumbar   2. History of nephrectomy, unilateral     Plan:Avoid frequent bending and stooping  No lifting greater than 10 lbs. May use ice or moist heat for pain. Weight loss is of benefit. Handicap license is approved.    Follow-Up Instructions: No Follow-up on file.   Orders:  No orders of the defined types were placed in this encounter.  No orders of the defined types were placed in this encounter.     Procedures: No procedures performed   Clinical Data: No additional findings.   Subjective: Chief Complaint  Patient presents with  . Lower Back - Follow-up    46 year old male status post right nephrectomy for renal mass. The results of the pathology indicate a benign lesion. He complains of some persistent back and leg discomfort Stopped PT due to recent nephrectomy 09/29/2016. Now one month post surgery and scheduled to return to work beginning 11/10/2016. No bowel difficulty. He was hospitalized  around the time of the [facility]. Feels weak and is noting difficulty with transition sitting and transition.    Review of Systems  Constitutional: Negative.   HENT: Negative.   Eyes: Negative.   Respiratory: Negative.   Cardiovascular: Negative.   Gastrointestinal: Negative.   Endocrine: Negative.   Genitourinary: Negative.   Musculoskeletal: Negative.   Skin: Negative.   Allergic/Immunologic: Negative.   Neurological: Negative.   Hematological: Negative.   Psychiatric/Behavioral: Negative.      Objective: Vital Signs: BP (!) 134/98 (BP Location: Left Arm, Patient Position: Sitting)   Pulse 65   Ht 5' 7.5" (1.715 m)    Wt 176 lb (79.8 kg)   BMI 27.16 kg/m   Physical Exam  Constitutional: He is oriented to person, place, and time. He appears well-developed and well-nourished.  HENT:  Head: Normocephalic and atraumatic.  Eyes: Pupils are equal, round, and reactive to light. EOM are normal.  Neck: Normal range of motion. Neck supple.  Pulmonary/Chest: Effort normal and breath sounds normal.  Abdominal: Soft. Bowel sounds are normal.  Neurological: He is alert and oriented to person, place, and time.  Skin: Skin is warm and dry.  Psychiatric: He has a normal mood and affect. His behavior is normal. Judgment and thought content normal.    Back Exam   Range of Motion  Extension: normal  Flexion: abnormal  Lateral Bend Right: abnormal  Lateral Bend Left: abnormal  Rotation Right: abnormal  Rotation Left: abnormal   Muscle Strength  Right Quadriceps:  5/5  Left Quadriceps:  5/5  Right Hamstrings:  5/5  Left Hamstrings:  5/5   Reflexes  Babinski's sign: normal   Other  Toe Walk: normal Heel Walk: normal Sensation: normal Gait: normal  Erythema: no back redness Scars: present      Specialty Comments:  No specialty comments available.  Imaging: No results found.   PMFS History: Patient Active Problem List   Diagnosis Date Noted  . History of nephrectomy, unilateral 10/27/2016  . DDD (degenerative disc disease), lumbar 10/27/2016  .  Renal mass 09/29/2016   Past Medical History:  Diagnosis Date  . Chronic kidney disease   . Hypertension     Family History  Problem Relation Age of Onset  . Hypertension Mother   . Heart disease Father     Past Surgical History:  Procedure Laterality Date  . LAPAROSCOPIC NEPHRECTOMY Right 09/29/2016   Procedure: LAPAROSCOPIC RADICAL  NEPHRECTOMY RIGHT;  Surgeon: Crist Fat, MD;  Location: [facility]S;  Service: Urology;  Laterality: Right;  . MASTECTOMY    . MASTECTOMY  1989   Social History   Occupational History  .  [employer]  [employer]    CNA   Social History Main Topics  . Smoking status: Current Every Day Smoker    Types: Cigars  . Smokeless tobacco: Never Used  . Alcohol use 1.2 oz/week    2 Cans of beer per week     Comment: daily  . Drug use: Yes    Types: Marijuana     Comment: this past Monday  . Sexual activity: Not on file

## 2016-10-29 ENCOUNTER — Encounter: Payer: Self-pay | Admitting: Family Medicine

## 2016-10-29 ENCOUNTER — Ambulatory Visit: Payer: Managed Care, Other (non HMO) | Attending: Family Medicine | Admitting: Family Medicine

## 2016-10-29 VITALS — BP 107/67 | HR 85 | Temp 98.5°F | Resp 18 | Ht 67.0 in | Wt 173.6 lb

## 2016-10-29 DIAGNOSIS — Z905 Acquired absence of kidney: Secondary | ICD-10-CM | POA: Insufficient documentation

## 2016-10-29 DIAGNOSIS — Z09 Encounter for follow-up examination after completed treatment for conditions other than malignant neoplasm: Secondary | ICD-10-CM | POA: Diagnosis not present

## 2016-10-29 DIAGNOSIS — E782 Mixed hyperlipidemia: Secondary | ICD-10-CM

## 2016-10-29 DIAGNOSIS — M5136 Other intervertebral disc degeneration, lumbar region: Secondary | ICD-10-CM | POA: Diagnosis not present

## 2016-10-29 DIAGNOSIS — Z79899 Other long term (current) drug therapy: Secondary | ICD-10-CM | POA: Insufficient documentation

## 2016-10-29 DIAGNOSIS — N2889 Other specified disorders of kidney and ureter: Secondary | ICD-10-CM | POA: Insufficient documentation

## 2016-10-29 DIAGNOSIS — I10 Essential (primary) hypertension: Secondary | ICD-10-CM | POA: Diagnosis present

## 2016-10-29 MED ORDER — PRAVASTATIN SODIUM 20 MG PO TABS: 20.0000 mg | ORAL_TABLET | Freq: Every day | ORAL | 2 refills | Status: DC

## 2016-10-29 MED ORDER — HYDROCHLOROTHIAZIDE 25 MG PO TABS: 12.5000 mg | ORAL_TABLET | Freq: Every day | ORAL | 2 refills | Status: DC

## 2016-10-29 NOTE — Progress Notes (Signed)
Patient is here for f/up HTN   Patient last BP  med dose was early morning

## 2016-10-29 NOTE — Patient Instructions (Signed)
DASH Eating Plan DASH stands for "Dietary Approaches to Stop Hypertension." The DASH eating plan is a healthy eating plan that has been shown to reduce high blood pressure (hypertension). It may also reduce your risk for type 2 diabetes, heart disease, and stroke. The DASH eating plan may also help with weight loss. What are tips for following this plan? General guidelines  Avoid eating more than 2,300 mg (milligrams) of salt (sodium) a day. If you have hypertension, you may need to reduce your sodium intake to 1,500 mg a day.  Limit alcohol intake to no more than 1 drink a day for nonpregnant women and 2 drinks a day for men. One drink equals 12 oz of beer, 5 oz of wine, or 1 oz of hard liquor.  Work with your health care provider to maintain a healthy body weight or to lose weight. Ask what an ideal weight is for you.  Get at least 30 minutes of exercise that causes your heart to beat faster (aerobic exercise) most days of the week. Activities may include walking, swimming, or biking.  Work with your health care provider or diet and nutrition specialist (dietitian) to adjust your eating plan to your individual calorie needs. Reading food labels  Check food labels for the amount of sodium per serving. Choose foods with less than 5 percent of the Daily Value of sodium. Generally, foods with less than 300 mg of sodium per serving fit into this eating plan.  To find whole grains, look for the word "whole" as the first word in the ingredient list. Shopping  Buy products labeled as "low-sodium" or "no salt added."  Buy fresh foods. Avoid canned foods and premade or frozen meals. Cooking  Avoid adding salt when cooking. Use salt-free seasonings or herbs instead of table salt or sea salt. Check with your health care provider or pharmacist before using salt substitutes.  Do not fry foods. Cook foods using healthy methods such as baking, boiling, grilling, and broiling instead.  Cook with  heart-healthy oils, such as olive, canola, soybean, or sunflower oil. Meal planning   Eat a balanced diet that includes: ? 5 or more servings of fruits and vegetables each day. At each meal, try to fill half of your plate with fruits and vegetables. ? Up to 6-8 servings of whole grains each day. ? Less than 6 oz of lean meat, poultry, or fish each day. A 3-oz serving of meat is about the same size as a deck of cards. One egg equals 1 oz. ? 2 servings of low-fat dairy each day. ? A serving of nuts, seeds, or beans 5 times each week. ? Heart-healthy fats. Healthy fats called Omega-3 fatty acids are found in foods such as flaxseeds and coldwater fish, like sardines, salmon, and mackerel.  Limit how much you eat of the following: ? Canned or prepackaged foods. ? Food that is high in trans fat, such as fried foods. ? Food that is high in saturated fat, such as fatty meat. ? Sweets, desserts, sugary drinks, and other foods with added sugar. ? Full-fat dairy products.  Do not salt foods before eating.  Try to eat at least 2 vegetarian meals each week.  Eat more home-cooked food and less restaurant, buffet, and fast food.  When eating at a restaurant, ask that your food be prepared with less salt or no salt, if possible. What foods are recommended? The items listed may not be a complete list. Talk with your dietitian about what   dietary choices are best for you. Grains Whole-grain or whole-wheat bread. Whole-grain or whole-wheat pasta. Brown rice. Oatmeal. Quinoa. Bulgur. Whole-grain and low-sodium cereals. Pita bread. Low-fat, low-sodium crackers. Whole-wheat flour tortillas. Vegetables Fresh or frozen vegetables (raw, steamed, roasted, or grilled). Low-sodium or reduced-sodium tomato and vegetable juice. Low-sodium or reduced-sodium tomato sauce and tomato paste. Low-sodium or reduced-sodium canned vegetables. Fruits All fresh, dried, or frozen fruit. Canned fruit in natural juice (without  added sugar). Meat and other protein foods Skinless chicken or turkey. Ground chicken or turkey. Pork with fat trimmed off. Fish and seafood. Egg whites. Dried beans, peas, or lentils. Unsalted nuts, nut butters, and seeds. Unsalted canned beans. Lean cuts of beef with fat trimmed off. Low-sodium, lean deli meat. Dairy Low-fat (1%) or fat-free (skim) milk. Fat-free, low-fat, or reduced-fat cheeses. Nonfat, low-sodium ricotta or cottage cheese. Low-fat or nonfat yogurt. Low-fat, low-sodium cheese. Fats and oils Soft margarine without trans fats. Vegetable oil. Low-fat, reduced-fat, or light mayonnaise and salad dressings (reduced-sodium). Canola, safflower, olive, soybean, and sunflower oils. Avocado. Seasoning and other foods Herbs. Spices. Seasoning mixes without salt. Unsalted popcorn and pretzels. Fat-free sweets. What foods are not recommended? The items listed may not be a complete list. Talk with your dietitian about what dietary choices are best for you. Grains Baked goods made with fat, such as croissants, muffins, or some breads. Dry pasta or rice meal packs. Vegetables Creamed or fried vegetables. Vegetables in a cheese sauce. Regular canned vegetables (not low-sodium or reduced-sodium). Regular canned tomato sauce and paste (not low-sodium or reduced-sodium). Regular tomato and vegetable juice (not low-sodium or reduced-sodium). Pickles. Olives. Fruits Canned fruit in a light or heavy syrup. Fried fruit. Fruit in cream or butter sauce. Meat and other protein foods Fatty cuts of meat. Ribs. Fried meat. Bacon. Sausage. Bologna and other processed lunch meats. Salami. Fatback. Hotdogs. Bratwurst. Salted nuts and seeds. Canned beans with added salt. Canned or smoked fish. Whole eggs or egg yolks. Chicken or turkey with skin. Dairy Whole or 2% milk, cream, and half-and-half. Whole or full-fat cream cheese. Whole-fat or sweetened yogurt. Full-fat cheese. Nondairy creamers. Whipped toppings.  Processed cheese and cheese spreads. Fats and oils Butter. Stick margarine. Lard. Shortening. Ghee. Bacon fat. Tropical oils, such as coconut, palm kernel, or palm oil. Seasoning and other foods Salted popcorn and pretzels. Onion salt, garlic salt, seasoned salt, table salt, and sea salt. Worcestershire sauce. Tartar sauce. Barbecue sauce. Teriyaki sauce. Soy sauce, including reduced-sodium. Steak sauce. Canned and packaged gravies. Fish sauce. Oyster sauce. Cocktail sauce. Horseradish that you find on the shelf. Ketchup. Mustard. Meat flavorings and tenderizers. Bouillon cubes. Hot sauce and Tabasco sauce. Premade or packaged marinades. Premade or packaged taco seasonings. Relishes. Regular salad dressings. Where to find more information:  National Heart, Lung, and Blood Institute: www.nhlbi.nih.gov  American Heart Association: www.heart.org Summary  The DASH eating plan is a healthy eating plan that has been shown to reduce high blood pressure (hypertension). It may also reduce your risk for type 2 diabetes, heart disease, and stroke.  With the DASH eating plan, you should limit salt (sodium) intake to 2,300 mg a day. If you have hypertension, you may need to reduce your sodium intake to 1,500 mg a day.  When on the DASH eating plan, aim to eat more fresh fruits and vegetables, whole grains, lean proteins, low-fat dairy, and heart-healthy fats.  Work with your health care provider or diet and nutrition specialist (dietitian) to adjust your eating plan to your individual   calorie needs. This information is not intended to replace advice given to you by your health care provider. Make sure you discuss any questions you have with your health care provider. Document Released: 12/24/2010 Document Revised: 12/29/2015 Document Reviewed: 12/29/2015 Elsevier Interactive Patient Education  2017 Elsevier Inc.  

## 2016-10-29 NOTE — Progress Notes (Deleted)
   Therapy next week  Nov 15

## 2016-10-30 NOTE — Progress Notes (Signed)
Subjective:  [name], male    DOB: 18-Jun-1970  Age: 46 y.o. MRN: [id number]  CC: Hypertension   HPI Diana Armijo presents for history of hypertension. He is not exercising and is adherent to low salt diet. He reports not taking antihypertensives daily. Cardiac symptoms none. Patient denies none.  Cardiovascular risk factors: hypertension, male gender, sedentary lifestyle and smoking/ tobacco exposure. Use of agents associated with hypertension: NSAID'S. History of target organ damage: none.  History of lumbar back pain from history of fall. Most recent MRI that showed renal mass and laparoscopic radical nephrectomy performed to the right kidney. He has since followed up with urologist. He denies any abdominal pain, dysuria, or hematuria.     Outpatient Medications Prior to Visit  Medication Sig Dispense Refill  . acetaminophen (TYLENOL) 500 MG tablet Take 1 tablet (500 mg total) by mouth every 6 (six) hours as needed. 100 tablet 2  . ranitidine (ZANTAC) 75 MG tablet Take 75 mg by mouth daily as needed for heartburn.    . traMADol (ULTRAM) 50 MG tablet Take 1-2 tablets (50-100 mg total) by mouth every 6 (six) hours as needed for moderate pain. 20 tablet 0  . hydrochlorothiazide (HYDRODIURIL) 25 MG tablet Take 0.5 tablets (12.5 mg total) by mouth daily. 30 tablet 2  . pravastatin (PRAVACHOL) 20 MG tablet Take 1 tablet (20 mg total) by mouth daily. 30 tablet 2  . methocarbamol (ROBAXIN) 500 MG tablet Take 1 tablet (500 mg total) by mouth 3 (three) times daily. X 10 days then prn muscle spasm (Patient not taking: Reported on 10/27/2016) 90 tablet 0   No facility-administered medications prior to visit.     ROS Review of Systems  Constitutional: Negative.   Eyes: Negative.   Respiratory: Negative.   Cardiovascular: Negative.   Gastrointestinal: Negative.   Genitourinary: Negative.   Musculoskeletal: Negative.   Skin: Negative.   Neurological: Negative.     Psychiatric/Behavioral: Negative.    Objective:  BP 107/67 (BP Location: Left Arm, Patient Position: Sitting, Cuff Size: Normal)   Pulse 85   Temp 98.5 F (36.9 C) (Oral)   Resp 18   Ht  (1.702 m)   Wt 173 lb 9.6 oz (78.7 kg)   SpO2 98%   BMI 27.19 kg/m   BP/Weight 10/29/2016 10/27/2016 09/30/2016  Systolic BP 107 134 142  Diastolic BP 67 98 86  Wt. (Lbs) 173.6 176 -  BMI 27.19 27.16 -    Physical Exam  Constitutional: He appears well-developed and well-nourished.  Eyes: Pupils are equal, round, and reactive to light. Conjunctivae are normal.  Neck: No JVD present.  Cardiovascular: Normal rate, regular rhythm, normal heart sounds and intact distal pulses.   Pulmonary/Chest: Effort normal and breath sounds normal.  Abdominal: Soft. Bowel sounds are normal. There is no tenderness.  Skin: Skin is warm and dry.  Linear surgical incision to right lower abdominal with surrounding  laparoscopic surgical sites . No tenderness or redness present.  Psychiatric: His mood appears anxious. He expresses no homicidal and no suicidal ideation. He expresses no suicidal plans and no homicidal plans.  Nursing note and vitals reviewed.  Assessment & Plan:   1. Essential hypertension BP controlled on current medication. - hydrochlorothiazide (HYDRODIURIL) 25 MG tablet; Take 0.5 tablets (12.5 mg total) by mouth daily.  Dispense: 30 tablet; Refill: 2  2. DDD (degenerative disc disease), lumbar He reports upcoming appointment with PT.  3. Follow up   4. Mixed hyperlipidemia  -  pravastatin (PRAVACHOL) 20 MG tablet; Take 1 tablet (20 mg total) by mouth daily.  Dispense: 30 tablet; Refill: 2 - Lipid Panel   Meds ordered this encounter  Medications  . pravastatin (PRAVACHOL) 20 MG tablet    Sig: Take 1 tablet (20 mg total) by mouth daily.    Dispense:  30 tablet    Refill:  2    Order Specific Question:   Supervising ProvideQuentin AngstGEDE, OLUGBEMIGA E [id number]  .  hydrochlorothiazide (HYDRODIURIL) 25 MG tablet    Sig: Take 0.5 tablets (12.5 mg total) by mouth daily.    Dispense:  30 tablet    Refill:  2    Order Specific Question:   Supervising Provider    Answer:   Quentin Angst [id number]    Follow-up: Return in about 3 months (around 01/29/2017) for HTN/HLD.   Lizbeth Bark FNP

## 2016-10-31 LAB — LIPID PANEL
CHOL/HDL RATIO: 5.4 ratio — AB (ref 0.0–5.0)
Cholesterol, Total: 227 mg/dL — ABNORMAL HIGH (ref 100–199)
HDL: 42 mg/dL (ref >39)
LDL CALC: 143 mg/dL — AB (ref 0–99)
TRIGLYCERIDES: 212 mg/dL — AB (ref 0–149)
VLDL Cholesterol Cal: 42 mg/dL — ABNORMAL HIGH (ref 5–40)

## 2016-11-03 ENCOUNTER — Ambulatory Visit: Payer: Managed Care, Other (non HMO) | Attending: Specialist | Admitting: Physical Therapy

## 2016-11-03 ENCOUNTER — Encounter: Payer: Self-pay | Admitting: Physical Therapy

## 2016-11-03 DIAGNOSIS — M62830 Muscle spasm of back: Secondary | ICD-10-CM | POA: Insufficient documentation

## 2016-11-03 DIAGNOSIS — M545 Low back pain: Secondary | ICD-10-CM | POA: Insufficient documentation

## 2016-11-03 DIAGNOSIS — R252 Cramp and spasm: Secondary | ICD-10-CM | POA: Insufficient documentation

## 2016-11-04 NOTE — Therapy (Signed)
[facility] [address] [place], [place], [address] Phone: [phone]   Fax:  [phone]  Physical Therapy Treatment  Patient Details  Name: Dave Jimenez MRN: [id number] Date of Birth: [birth year] Referring Provider: Vira Browns MD   Encounter Date: 11/03/2016      PT End of Session - 11/04/16 1328    Visit Number 1   Number of Visits 12   Date for PT Re-Evaluation 12/16/16   Authorization Type [employer]    PT Start Time 1145   PT Stop Time 1229   PT Time Calculation (min) 44 min   Activity Tolerance Patient tolerated treatment well   Behavior During Therapy [facility] for tasks assessed/performed      Past Medical History:  Diagnosis Date  . Chronic kidney disease   . Hypertension     Past Surgical History:  Procedure Laterality Date  . LAPAROSCOPIC NEPHRECTOMY Right 09/29/2016   Procedure: LAPAROSCOPIC RADICAL  NEPHRECTOMY RIGHT;  Surgeon: Crist Fat, MD;  Location: [facility];  Service: Urology;  Laterality: Right;  . MASTECTOMY    . MASTECTOMY  1989    There were no vitals filed for this visit.      Subjective Assessment - 11/03/16 1157    Subjective Patient was having therapy for his back but then he had to have his kidney removed. He is now back and he is hoping to continue his progression back to work. He will go back to work next week. he continues to have pain in his lower back. He needs to be able to lift 75 lbs.    How long can you sit comfortably? depends on chair   How long can you stand comfortably? 15-20 minutes   How long can you walk comfortably? AS needed   Diagnostic tests MRI: Multi level disc degeneration    Patient Stated Goals return to normal activity   Currently in Pain? Yes   Pain Score 5    Pain Location Abdomen   Pain Orientation Right;Mid   Pain Descriptors / Indicators Aching   Pain Type Chronic pain   Pain Radiating Towards down R LE    Pain Onset More than a month ago   Pain  Frequency Intermittent   Aggravating Factors  frequent bending    Pain Relieving Factors good body mechanics    Effect of Pain on Daily Activities limits work and ADL's             [facility] PT Assessment - 11/04/16 0001      Assessment   Medical Diagnosis Acute LBP    Referring Provider Vira Browns MD    Onset Date/Surgical Date 05/05/16  6 months prior    Next MD Visit 5 weeks   Prior Therapy Yes prior to nephrectomy      Precautions   Precautions Other (comment)   Precaution Comments 25 lb lifting restriction      Restrictions   Weight Bearing Restrictions No     Prior Function   Level of Independence Independent   Vocation Full time employment   Vocation Requirements CNA work   Leisure basketball     Cognition   Overall Cognitive Status Within Functional Limits for tasks assessed     Observation/Other Assessments   Skin Integrity large scar in the right side of his abdomen    Focus on Therapeutic Outcomes (FOTO)  44% limitation      Sensation   Light Touch Appears Intact   Additional Comments denies  parathesias      Coordination   Gross Motor Movements are Fluid and Coordinated Yes   Fine Motor Movements are Fluid and Coordinated Yes     AROM   Lumbar Flexion 25 % limited with pain    Lumbar Extension WNL    Lumbar - Right Side Bend WNL    Lumbar - Left Side Bend WNL      Strength   Overall Strength Deficits   Right/Left Hip Right;Left   Right Hip Flexion 4+/5   Right Hip ABduction 5/5   Right Hip ADduction 5/5   Left Hip Flexion 4+/5   Left Hip Internal Rotation 5/5   Left Hip ABduction 5/5     Flexibility   Hamstrings 60 degrees bilateral      Palpation   Palpation comment spasming of bilateral QL                      [facility] Adult PT Treatment/Exercise - 11/04/16 0001      Lumbar Exercises: Stretches   Passive Hamstring Stretch 2 reps;30 seconds   Quadruped Mid Back Stretch Limitations prayer stretch and lateral 1x20 sec each;  also shown how to do sink stretch    Quad Stretch Limitations thomas stretch with right UE reach    Piriformis Stretch Limitations 2x30sec hold                 PT Education - 11/04/16 1328    Education provided Yes   Education Details updated HEP for new stretches; reviewed technique    Person(s) Educated Patient   Methods Explanation;Demonstration   Comprehension Verbalized understanding;Returned demonstration;Tactile cues required;Verbal cues required          PT Short Term Goals - 11/04/16 1336      PT SHORT TERM GOAL #1   Title Pt will be independent with inital HEP.    Time 3   Period Weeks   Status New     PT SHORT TERM GOAL #2   Title He will report pain less then 3/10 at worst    Time 3   Period Weeks   Status Achieved     PT SHORT TERM GOAL #3   Title Patient will demsotrate 5/5 gross bilateral lower extremity strength    Time 3   Period Weeks   Status New           PT Long Term Goals - 11/04/16 1400      PT LONG TERM GOAL #1   Title Patient will return to full exercise program without pain    Time 6   Period Weeks   Status New     PT LONG TERM GOAL #2   Title Patient ewill return to work iwithout increased lower back pain    Time 6   Period Weeks   Status New     PT LONG TERM GOAL #3   Title Patient will demostrate proper technique with transfers and lifting    Time 6   Period Weeks   Status New     PT LONG TERM GOAL #4   Title Patient will lfit 75lb box without increased pain    Time 6   Period Weeks   Status On-going               Plan - 11/04/16 1522    Clinical Impression Statement Patient is a 46 year old male with lower back pain. He was ahaving therapy but had to  stop 2nd to a radical nephrectomy. He currently has bilateral lumbar spasming and pain. 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 He would benefit from further therapy to decrease pain at work.    Clinical Presentation Stable   Clinical  Decision Making Low   Rehab Potential Good   PT Frequency 2x / week   PT Duration 6 weeks   PT Treatment/Interventions Electrical Stimulation;Iontophoresis 4mg /ml Dexamethasone;Moist Heat;Traction;Ultrasound;Passive range of motion;Patient/family education;Manual techniques;Therapeutic exercise;Therapeutic activities;Dry needling   PT Next Visit Plan discharge today.    PT Home Exercise Plan LTR , PPT, cat/camel, prayer stretch, posture education, seated pelvic tilt, Supine table top with heel  taps    Consulted and Agree with Plan of Care Patient      Patient will benefit from skilled therapeutic intervention in order to improve the following deficits and impairments:  Pain, Decreased activity tolerance, Decreased range of motion, Increased muscle spasms, Decreased strength, Decreased mobility  Visit Diagnosis: Acute low back pain, unspecified back pain laterality, with sciatica presence unspecified  Muscle spasm of back     Problem List Patient Active Problem List   Diagnosis Date Noted  . History of nephrectomy, unilateral 10/27/2016  . DDD (degenerative disc disease), lumbar 10/27/2016  . Renal mass 09/29/2016    Dessie Comaavid J Journi Moffa PT DPT  11/04/2016, 3:25 PM  [facility] [address] [place], [place], [address] Phone: [phone]   Fax:  [phone]  Name: Dave Jimenez MRN: [id number] Date of Birth: 09/01/1970

## 2016-11-09 ENCOUNTER — Ambulatory Visit: Payer: Managed Care, Other (non HMO) | Admitting: Physical Therapy

## 2016-11-11 ENCOUNTER — Ambulatory Visit: Payer: Managed Care, Other (non HMO) | Admitting: Physical Therapy

## 2016-11-15 ENCOUNTER — Ambulatory Visit: Payer: Managed Care, Other (non HMO) | Admitting: Physical Therapy

## 2016-11-15 ENCOUNTER — Encounter: Payer: Self-pay | Admitting: Physical Therapy

## 2016-11-15 DIAGNOSIS — M545 Low back pain: Secondary | ICD-10-CM

## 2016-11-15 DIAGNOSIS — M62830 Muscle spasm of back: Secondary | ICD-10-CM

## 2016-11-15 NOTE — Therapy (Signed)
[facility] [address] [place], [place], [address] Phone: [phone]   Fax:  [phone]  Physical Therapy Treatment  Patient Details  Name: Dave Jimenez MRN: [id number] Date of Birth: August 11, 1970 Referring Provider: Vira Browns MD   Encounter Date: 11/11/2016    Past Medical History:  Diagnosis Date  . Chronic kidney disease   . Hypertension     Past Surgical History:  Procedure Laterality Date  . LAPAROSCOPIC NEPHRECTOMY Right 09/29/2016   Procedure: LAPAROSCOPIC RADICAL  NEPHRECTOMY RIGHT;  Surgeon: Crist Fat, MD;  Location: [facility];  Service: Urology;  Laterality: Right;  . MASTECTOMY    . MASTECTOMY  1989    There were no vitals filed for this visit.      Subjective Assessment - 11/15/16 0746    Subjective Patient comes in having worked that night. He reports his back has been sore. He has been working a long night and he is sore. The patient reported he knew he missed the last visit but he was busy filling out paper work.  [employer] requested that he contact therapy if he knows that he is going to miss an appointment. St this time the patien became very agitated. He then abuplty left the treatment.                                    PT Short Term Goals - 11/04/16 1336      PT SHORT TERM GOAL #1   Title Pt will be independent with inital HEP.    Time 3   Period Weeks   Status New     PT SHORT TERM GOAL #2   Title He will report pain less then 3/10 at worst    Time 3   Period Weeks   Status Achieved     PT SHORT TERM GOAL #3   Title Patient will demsotrate 5/5 gross bilateral lower extremity strength    Time 3   Period Weeks   Status New           PT Long Term Goals - 11/04/16 1400      PT LONG TERM GOAL #1   Title Patient will return to full exercise program without pain    Time 6   Period Weeks   Status New     PT LONG TERM GOAL #2   Title Patient ewill  return to work iwithout increased lower back pain    Time 6   Period Weeks   Status New     PT LONG TERM GOAL #3   Title Patient will demostrate proper technique with transfers and lifting    Time 6   Period Weeks   Status New     PT LONG TERM GOAL #4   Title Patient will lfit 75lb box without increased pain    Time 6   Period Weeks   Status On-going               Plan - 11/15/16 0749    Clinical Impression Statement See subjective. Patient left 2nd to requesting he call if he is going to miss a visit.    Clinical Presentation Stable   Clinical Decision Making Low      Patient will benefit from skilled therapeutic intervention in order to improve the following deficits and impairments:  Pain, Decreased range of motion, Increased muscle spasms, Decreased strength, Decreased  mobility  Visit Diagnosis: Acute low back pain, unspecified back pain laterality, with sciatica presence unspecified  Muscle spasm of back  Cramp and spasm     Problem List Patient Active Problem List   Diagnosis Date Noted  . History of nephrectomy, unilateral 10/27/2016  . DDD (degenerative disc disease), lumbar 10/27/2016  . Renal mass 09/29/2016    Dave Jimenez  PT DPT  11/15/2016, 7:50 AM  [facility] [address] [place], [place], [address] Phone: [phone]   Fax:  [phone]  Name: Dave Jimenez MRN: [id number] Date of Birth: 10/03/1970

## 2016-11-15 NOTE — Therapy (Signed)
[facility] [address] [place], [place], [address] Phone: [phone]   Fax:  [phone]  Physical Therapy Treatment  Patient Details  Name: Dave Jimenez MRN: [id number] Date of Birth: 04-12-1970 Referring Provider: Vira Browns MD   Encounter Date: 11/15/2016      PT End of Session - 11/15/16 1231    Visit Number 2   Number of Visits 12   Date for PT Re-Evaluation 12/16/16   Authorization Type [employer]    PT Start Time 0930   PT Stop Time 1012   PT Time Calculation (min) 42 min   Activity Tolerance Patient tolerated treatment well   Behavior During Therapy Dave Jimenez for tasks assessed/performed      Past Medical History:  Diagnosis Date  . Chronic kidney disease   . Hypertension     Past Surgical History:  Procedure Laterality Date  . LAPAROSCOPIC NEPHRECTOMY Right 09/29/2016   Procedure: LAPAROSCOPIC RADICAL  NEPHRECTOMY RIGHT;  Surgeon: Dave Fat, MD;  Location: [facility];  Service: Urology;  Laterality: Right;  . MASTECTOMY    . MASTECTOMY  1989    There were no vitals filed for this visit.      Subjective Assessment - 11/15/16 0949    Subjective Patient reports the back has gotten better but he is still having some difficulty at work. He would like to work on patient movement techniques.    How long can you sit comfortably? depends on chair   How long can you stand comfortably? 15-20 minutes   How long can you walk comfortably? AS needed   Diagnostic tests MRI: Multi level disc degeneration    Patient Stated Goals return to normal activity   Currently in Pain? Yes   Pain Score 3    Pain Location Back   Pain Orientation Right;Mid   Pain Descriptors / Indicators Aching   Pain Type Chronic pain   Pain Radiating Towards down R LE    Aggravating Factors  frequent bendinf    Pain Relieving Factors good body mechanics    Effect of Pain on Daily Activities limts work and ADL's                           [facility] Adult PT Treatment/Exercise - 11/15/16 0001      Therapeutic Activites    Work Engineer, materials sled 3 laps; rolling with a box 40 lbs 2x15;      Lumbar Exercises: Community education officer 2 reps;30 seconds   Quad Stretch Limitations thomas stretch with right UE reach    Piriformis Stretch Limitations 2x30sec hold      Lumbar Exercises: Standing   Row Limitations 2x10 green with abdominal breathing    Shoulder Extension Limitations 2x10 green with abdominal breathing      Lumbar Exercises: Supine   Bent Knee Raise Limitations 2x10 with abdominal brace    Bridge Limitations 2x10                PT Education - 11/15/16 1231    Education provided Yes   Education Details reviewed HEP and technique with lifting    Person(s) Educated Patient   Methods Explanation;Demonstration   Comprehension Verbalized understanding;Returned demonstration;Verbal cues required;Tactile cues required          PT Short Term Goals - 11/04/16 1336      PT SHORT TERM GOAL #1   Title Pt will be independent with inital  HEP.    Time 3   Period Weeks   Status New     PT SHORT TERM GOAL #2   Title He will report pain less then 3/10 at worst    Time 3   Period Weeks   Status Achieved     PT SHORT TERM GOAL #3   Title Patient will demsotrate 5/5 gross bilateral lower extremity strength    Time 3   Period Weeks   Status New           PT Long Term Goals - 11/04/16 1400      PT LONG TERM GOAL #1   Title Patient will return to full exercise program without pain    Time 6   Period Weeks   Status New     PT LONG TERM GOAL #2   Title Patient ewill return to work iwithout increased lower back pain    Time 6   Period Weeks   Status New     PT LONG TERM GOAL #3   Title Patient will demostrate proper technique with transfers and lifting    Time 6   Period Weeks   Status New     PT LONG TERM GOAL #4   Title Patient will  lfit 75lb box without increased pain    Time 6   Period Weeks   Status On-going               Plan - 11/15/16 1232    Clinical Impression Statement Patient tolerated treatment well. He adjusted well with cuing for lifting techinque. He was also shown stretches he can do at work Patient is very motivated to improve strength and stability.    Clinical Presentation Stable   Clinical Decision Making Low   Rehab Potential Good   PT Frequency 2x / week   PT Duration 6 weeks   PT Treatment/Interventions Electrical Stimulation;Iontophoresis 4mg /ml Dexamethasone;Moist Heat;Traction;Ultrasound;Passive range of motion;Patient/family education;Manual techniques;Therapeutic exercise;Therapeutic activities;Dry needling   PT Next Visit Plan continue wto work on core strengthening and lifting technique; Manual therapy if needed.    PT Home Exercise Plan LTR , PPT, cat/camel, prayer stretch, posture education, seated pelvic tilt, Supine table top with heel  taps    Consulted and Agree with Plan of Care Patient      Patient will benefit from skilled therapeutic intervention in order to improve the following deficits and impairments:  Pain, Decreased range of motion, Increased muscle spasms, Decreased strength, Decreased mobility  Visit Diagnosis: Acute low back pain, unspecified back pain laterality, with sciatica presence unspecified  Muscle spasm of back     Problem List Patient Active Problem List   Diagnosis Date Noted  . History of nephrectomy, unilateral 10/27/2016  . DDD (degenerative disc disease), lumbar 10/27/2016  . Renal mass 09/29/2016    Dave Jimenez PT DPT  11/15/2016, 12:35 PM  [facility] [address] [place], [place], [address] Phone: [phone]   Fax:  [phone]  Name: Dave MelterSidney Jimenez MRN: [id number] Date of Birth: 05/15/1970

## 2016-11-16 ENCOUNTER — Other Ambulatory Visit: Payer: Self-pay | Admitting: Family Medicine

## 2016-11-16 DIAGNOSIS — E782 Mixed hyperlipidemia: Secondary | ICD-10-CM

## 2016-11-16 MED ORDER — PRAVASTATIN SODIUM 80 MG PO TABS: 80.0000 mg | ORAL_TABLET | Freq: Every day | ORAL | 5 refills | Status: DC

## 2016-11-17 ENCOUNTER — Ambulatory Visit: Payer: Managed Care, Other (non HMO) | Admitting: Physical Therapy

## 2016-11-17 ENCOUNTER — Other Ambulatory Visit: Payer: Self-pay | Admitting: Family Medicine

## 2016-11-17 ENCOUNTER — Telehealth: Payer: Self-pay

## 2016-11-17 DIAGNOSIS — E782 Mixed hyperlipidemia: Secondary | ICD-10-CM

## 2016-11-17 MED ORDER — PRAVASTATIN SODIUM 20 MG PO TABS: 20.0000 mg | ORAL_TABLET | Freq: Every day | ORAL | 5 refills | Status: DC

## 2016-11-17 NOTE — Telephone Encounter (Signed)
CMA call regarding lab results   Patient verify DOB  Patient was aware and understood  

## 2016-11-17 NOTE — Telephone Encounter (Signed)
-----   Message from Lizbeth BarkMandesia R Hairston, FNP sent at 11/16/2016  7:47 PM EDT ----- Lipid levels are elevated on current dosage of pravastatin. Your dose of pravastatin will be increased. Limit your intake of fried foods, red meats, and whole milk.  Recommend recheck in 6 months

## 2016-11-17 NOTE — Progress Notes (Signed)
Spoke with patient patient stated that the reason his lipid came elevated is because he was not taking his medication for it so he wants to be back on his current dosage and he will start to taking it

## 2016-11-22 ENCOUNTER — Ambulatory Visit: Payer: Managed Care, Other (non HMO) | Attending: Specialist | Admitting: Physical Therapy

## 2016-11-22 DIAGNOSIS — M62830 Muscle spasm of back: Secondary | ICD-10-CM | POA: Diagnosis present

## 2016-11-22 DIAGNOSIS — M545 Low back pain: Secondary | ICD-10-CM | POA: Diagnosis present

## 2016-11-22 DIAGNOSIS — R252 Cramp and spasm: Secondary | ICD-10-CM | POA: Diagnosis present

## 2016-11-23 NOTE — Therapy (Addendum)
[place], [place], [address] Phone: [phone]   Fax:  [phone]  Physical Therapy Treatment/ Discharge   Patient Details  Name: Dave Jimenez MRN: [id number] Date of Birth: 09-17-1970 Referring Provider: Basil Dess MD    Encounter Date: 11/22/2016  PT End of Session - 11/23/16 1605    Visit Number  3    Number of Visits  12    Date for PT Re-Evaluation  12/16/16    Authorization Type  [employer]     PT Start Time  1640    PT Stop Time  76 Pateint left early 2nd to having to go back to work    Pateint left early 2nd to having to go back to work    PT Time Calculation (min)  25 min    Activity Tolerance  Patient tolerated treatment well    Behavior During Therapy  WFL for tasks assessed/performed       Past Medical History:  Diagnosis Date  . Chronic kidney disease   . Hypertension     Past Surgical History:  Procedure Laterality Date  . MASTECTOMY    . MASTECTOMY  1989    There were no vitals filed for this visit.  Subjective Assessment - 11/23/16 1559    Subjective  Patient came 10 minutes late then reported he needed to get back to work as soon as possible. He reports his work schedule has changed and he may not be able to make more visits. 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     How long can you sit comfortably?  depends on chair    How long can you stand comfortably?  15-20 minutes    How long can you walk comfortably?  AS needed    Currently in Pain?  Yes    Pain Score  3     Pain Location  Back    Pain Orientation  Right;Mid    Pain Descriptors / Indicators  Aching    Pain Onset  More than a month ago    Pain Frequency  Intermittent    Aggravating Factors   moving clients     Pain Relieving Factors  good body mechanics     Effect of Pain on Daily Activities  limits work and ADL's                        [facility] Adult PT Treatment/Exercise - 11/23/16 0001      Self-Care   Self-Care  Other Self-Care Comments    Other Self-Care Comments   reviewedsoft tissue mobilization using a tennis ball and thge shepards hook. Patient will buy a shepards hook most likley. Therepay reviewed thoery behind soft tissue mobilization.              PT Education - 11/23/16 1604    Education provided  Yes    Education Details  reviewed technique with self soft tissue mobilization     Person(s) Educated  Patient    Methods  Explanation;Demonstration    Comprehension  Verbalized understanding;Returned demonstration;Verbal cues required;Tactile cues required       PT Short Term Goals - 11/04/16 1336      PT SHORT TERM GOAL #1   Title  Pt will be independent with inital HEP.     Time  3    Period  Weeks  Status  New      PT SHORT TERM GOAL #2   Title  He will report pain less then 3/10 at worst     Time  3    Period  Weeks    Status  Achieved      PT SHORT TERM GOAL #3   Title  Patient will demsotrate 5/5 gross bilateral lower extremity strength     Time  3    Period  Weeks    Status  New        PT Long Term Goals - 11/04/16 1400      PT LONG TERM GOAL #1   Title  Patient will return to full exercise program without pain     Time  6    Period  Weeks    Status  New      PT LONG TERM GOAL #2   Title  Patient ewill return to work iwithout increased lower back pain     Time  6    Period  Weeks    Status  New      PT LONG TERM GOAL #3   Title  Patient will demostrate proper technique with transfers and lifting     Time  6    Period  Weeks    Status  New      PT LONG TERM GOAL #4   Title  Patient will lfit 75lb box without increased pain     Time  6    Period  Weeks    Status  On-going            Plan - 11/23/16 1606    Clinical Impression Statement  Therapy reviewed self soft tissue mobilization. He then had to go back to work. He  feels comfortable with his exercise program. he may come back for more visits but he is not sure if he is going to be able to.     Clinical Presentation  Stable    Clinical Decision Making  Low    Rehab Potential  Good    PT Frequency  2x / week    PT Duration  6 weeks    PT Treatment/Interventions  Electrical Stimulation;Iontophoresis 61m/ml Dexamethasone;Moist Heat;Traction;Ultrasound;Passive range of motion;Patient/family education;Manual techniques;Therapeutic exercise;Therapeutic activities;Dry needling    PT Next Visit Plan  continue wto work on core strengthening and lifting technique; Manual therapy if needed.     PT Home Exercise Plan  LTR , PPT, cat/camel, prayer stretch, posture education, seated pelvic tilt, Supine table top with heel  taps     Consulted and Agree with Plan of Care  Patient       Patient will benefit from skilled therapeutic intervention in order to improve the following deficits and impairments:  Pain, Decreased range of motion, Increased muscle spasms, Decreased strength, Decreased mobility  Visit Diagnosis: Acute low back pain, unspecified back pain laterality, with sciatica presence unspecified  Muscle spasm of back  Cramp and spasm    PHYSICAL THERAPY DISCHARGE SUMMARY  Visits from Start of Care: 3  Current functional level related to goals / functional outcomes: Decided to work on exercises on his own.    Remaining deficits: Having intermittent pain    Education / Equipment: HEP   Plan: Patient agrees to discharge.  Patient goals were not met. Patient is being discharged due to meeting the stated rehab goals.  ?????      Problem List Patient Active Problem List   Diagnosis Date  Noted  . Mixed hyperlipidemia 11/17/2016  . History of nephrectomy, unilateral 10/27/2016  . DDD (degenerative disc disease), lumbar 10/27/2016  . Renal mass 09/29/2016    Carney Living PT DPT  11/23/2016, 4:10 PM  [facility] [address] [place], [place], [address] Phone: [phone]   Fax:  [phone]  Name: Preet Perrier MRN: [id number] Date of Birth: Feb 22, 1970

## 2016-11-24 ENCOUNTER — Telehealth: Payer: Self-pay | Admitting: Physical Therapy

## 2016-11-24 ENCOUNTER — Ambulatory Visit: Payer: Managed Care, Other (non HMO) | Admitting: Physical Therapy

## 2016-11-24 NOTE — Telephone Encounter (Signed)
Spoke to patient regarding appointment today. He reports he was not able to get out of work. He feels comfortable with his exercise program and will continue on his own.

## 2016-11-29 ENCOUNTER — Encounter: Payer: Managed Care, Other (non HMO) | Admitting: Physical Therapy

## 2016-12-01 ENCOUNTER — Encounter: Payer: Managed Care, Other (non HMO) | Admitting: Physical Therapy

## 2016-12-02 ENCOUNTER — Ambulatory Visit (INDEPENDENT_AMBULATORY_CARE_PROVIDER_SITE_OTHER): Payer: Managed Care, Other (non HMO) | Admitting: Specialist

## 2016-12-02 ENCOUNTER — Encounter (INDEPENDENT_AMBULATORY_CARE_PROVIDER_SITE_OTHER): Payer: Self-pay | Admitting: Specialist

## 2016-12-02 VITALS — BP 129/85 | HR 82 | Ht 67.5 in | Wt 176.0 lb

## 2016-12-02 DIAGNOSIS — M5126 Other intervertebral disc displacement, lumbar region: Secondary | ICD-10-CM

## 2016-12-02 DIAGNOSIS — M5136 Other intervertebral disc degeneration, lumbar region: Secondary | ICD-10-CM

## 2016-12-02 NOTE — Patient Instructions (Addendum)
Avoid frequent bending and stooping  No lifting greater than 25-35lbs. May use ice or moist heat for pain. Weight loss is of benefit. Physical therapy has provided exercises and you should do these at least 3 times a week.  Currently there is no focal muscle deficit in the legs. You will probably need 2 more months to recover from the nephrectomy. RTC in 3 months, like discharge at that point.

## 2016-12-02 NOTE — Progress Notes (Signed)
Office Visit Note   Patient: Dave MelterSidney Converse           Date of Birth: 05/03/1970           MRN: [id number] Visit Date: 12/02/2016              Requested by: Lizbeth BarkHairston, Mandesia R, FNP [address] [place], [place] [address] PCP: Lizbeth BarkHairston, Mandesia R, FNP   Assessment & Plan: Visit Diagnoses:  1. 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 Degenerative disc disease, lumbar     Plan: Avoid frequent bending and stooping  No lifting greater than 25-35 lbs. May use ice or moist heat for pain. Weight loss is of benefit. Physical therapy has provided exercises and you should do these at least 3 times a week.  Currently there is no focal muscle deficit in the legs. You will probably need 2 more months to recover from the nephrectomy. RTC in 3 months, like discharge at that point.   Follow-Up Instructions: Return in about 2 months (around 02/01/2017).   Orders:  No orders of the defined types were placed in this encounter.  No orders of the defined types were placed in this encounter.     Procedures: No procedures performed   Clinical Data: No additional findings.   Subjective: Chief Complaint  Patient presents with  . Lower Back - Follow-up    46 year old male with lumbar degenerative disc disease, found on MRI with renal lesion on the right kidney. He is now 2 months post right nephrectomy. He has discogenic pain and has gone to PT starting last week with an exercise program. 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 Now post back injury with a period of convalescence from major surgery for a right renal lesion.     Review of Systems  Constitutional: Negative.   HENT: Negative.   Eyes: Negative.   Respiratory: Negative.   Cardiovascular: Negative.   Gastrointestinal: Negative.   Endocrine: Negative.   Genitourinary: Negative.   Musculoskeletal: Negative.    Skin: Negative.   Allergic/Immunologic: Negative.   Neurological: Negative.   Hematological: Negative.   Psychiatric/Behavioral: Negative.      Objective: Vital Signs: BP 129/85 (BP Location: Left Arm, Patient Position: Sitting)   Pulse 82   Ht 5' 7.5" (1.715 m)   Wt 176 lb (79.8 kg)   BMI 27.16 kg/m   Physical Exam  Constitutional: He is oriented to person, place, and time. He appears well-developed and well-nourished.  HENT:  Head: Normocephalic and atraumatic.  Eyes: EOM are normal. Pupils are equal, round, and reactive to light.  Neck: Normal range of motion. Neck supple.  Pulmonary/Chest: Effort normal and breath sounds normal.  Abdominal: Soft. Bowel sounds are normal.  Neurological: He is alert and oriented to person, place, and time.  Skin: Skin is warm and dry.  Psychiatric: He has a normal mood and affect. His behavior is normal. Judgment and thought content normal.    Back Exam   Tenderness  The patient is experiencing tenderness in the lumbar.  Range of Motion  Extension: abnormal  Flexion: normal  Lateral bend right: abnormal  Lateral bend left: abnormal  Rotation right: abnormal  Rotation left: abnormal   Muscle Strength  Right Quadriceps:  5/5  Left Quadriceps:  5/5  Right Hamstrings:  5/5  Left Hamstrings:  5/5   Tests  Straight  leg raise right: negative Straight leg raise left: negative  Reflexes  Patellar:  Hyporeflexic abnormal Achilles:  Hyporeflexic abnormal Babinski's sign: normal   Other  Toe walk: normal Heel walk: normal Sensation: normal      Specialty Comments:  No specialty comments available.  Imaging: No results found.   PMFS History: Patient Active Problem List   Diagnosis Date Noted  . Mixed hyperlipidemia 11/17/2016  . History of nephrectomy, unilateral 10/27/2016  . DDD (degenerative disc disease), lumbar 10/27/2016  . Renal mass 09/29/2016   Past Medical History:  Diagnosis Date  . Chronic kidney  disease   . Hypertension     Family History  Problem Relation Age of Onset  . Hypertension Mother   . Heart disease Father     Past Surgical History:  Procedure Laterality Date  . LAPAROSCOPIC NEPHRECTOMY Right 09/29/2016   Procedure: LAPAROSCOPIC RADICAL  NEPHRECTOMY RIGHT;  Surgeon: Crist FatHerrick, Benjamin W, MD;  Location: [facility];  Service: Urology;  Laterality: Right;  . MASTECTOMY    . MASTECTOMY  1989   Social History   Occupational History    Employer: [employer]    Comment: CNA  Tobacco Use  . Smoking status: Current Every Day Smoker    Types: Cigars  . Smokeless tobacco: Never Used  Substance and Sexual Activity  . Alcohol use: Yes    Alcohol/week: 1.2 oz    Types: 2 Cans of beer per week    Comment: daily  . Drug use: Yes    Types: Marijuana    Comment: this past Monday  . Sexual activity: Not on file

## 2017-01-10 ENCOUNTER — Other Ambulatory Visit: Payer: Self-pay | Admitting: Family Medicine

## 2017-01-10 DIAGNOSIS — I10 Essential (primary) hypertension: Secondary | ICD-10-CM

## 2017-01-14 ENCOUNTER — Other Ambulatory Visit: Payer: Self-pay | Admitting: Family Medicine

## 2017-01-14 DIAGNOSIS — E782 Mixed hyperlipidemia: Secondary | ICD-10-CM

## 2017-01-31 ENCOUNTER — Encounter: Payer: Self-pay | Admitting: Family Medicine

## 2017-01-31 ENCOUNTER — Ambulatory Visit: Payer: Managed Care, Other (non HMO) | Attending: Family Medicine | Admitting: Family Medicine

## 2017-01-31 VITALS — BP 137/87 | HR 66 | Temp 98.7°F | Resp 18 | Ht 67.0 in | Wt 182.0 lb

## 2017-01-31 DIAGNOSIS — M545 Low back pain: Secondary | ICD-10-CM | POA: Insufficient documentation

## 2017-01-31 DIAGNOSIS — Z905 Acquired absence of kidney: Secondary | ICD-10-CM | POA: Diagnosis not present

## 2017-01-31 DIAGNOSIS — Z79891 Long term (current) use of opiate analgesic: Secondary | ICD-10-CM | POA: Diagnosis not present

## 2017-01-31 DIAGNOSIS — N2889 Other specified disorders of kidney and ureter: Secondary | ICD-10-CM | POA: Diagnosis not present

## 2017-01-31 DIAGNOSIS — Z79899 Other long term (current) drug therapy: Secondary | ICD-10-CM | POA: Diagnosis not present

## 2017-01-31 DIAGNOSIS — E782 Mixed hyperlipidemia: Secondary | ICD-10-CM

## 2017-01-31 DIAGNOSIS — I10 Essential (primary) hypertension: Secondary | ICD-10-CM

## 2017-01-31 NOTE — Patient Instructions (Addendum)
Managing Your Hypertension Hypertension is commonly called high blood pressure. This is when the force of your blood pressing against the walls of your arteries is too strong. Arteries are blood vessels that carry blood from your heart throughout your body. Hypertension forces the heart to work harder to pump blood, and may cause the arteries to become narrow or stiff. Having untreated or uncontrolled hypertension can cause heart attack, stroke, kidney disease, and other problems. What are blood pressure readings? A blood pressure reading consists of a higher number over a lower number. Ideally, your blood pressure should be below 120/80. The first ("top") number is called the systolic pressure. It is a measure of the pressure in your arteries as your heart beats. The second ("bottom") number is called the diastolic pressure. It is a measure of the pressure in your arteries as the heart relaxes. What does my blood pressure reading mean? Blood pressure is classified into four stages. Based on your blood pressure reading, your health care provider may use the following stages to determine what type of treatment you need, if any. Systolic pressure and diastolic pressure are measured in a unit called mm Hg. Normal  Systolic pressure: below 120.  Diastolic pressure: below 80. Elevated  Systolic pressure: 120-129.  Diastolic pressure: below 80. Hypertension stage 1  Systolic pressure: 130-139.  Diastolic pressure: 80-89. Hypertension stage 2  Systolic pressure: 140 or above.  Diastolic pressure: 90 or above. What health risks are associated with hypertension? Managing your hypertension is an important responsibility. Uncontrolled hypertension can lead to:  A heart attack.  A stroke.  A weakened blood vessel (aneurysm).  Heart failure.  Kidney damage.  Eye damage.  Metabolic syndrome.  Memory and concentration problems.  What changes can I make to manage my  hypertension? Hypertension can be managed by making lifestyle changes and possibly by taking medicines. Your health care provider will help you make a plan to bring your blood pressure within a normal range. Eating and drinking  Eat a diet that is high in fiber and potassium, and low in salt (sodium), added sugar, and fat. An example eating plan is called the DASH (Dietary Approaches to Stop Hypertension) diet. To eat this way: ? Eat plenty of fresh fruits and vegetables. Try to fill half of your plate at each meal with fruits and vegetables. ? Eat whole grains, such as whole wheat pasta, brown rice, or whole grain bread. Fill about one quarter of your plate with whole grains. ? Eat low-fat diary products. ? Avoid fatty cuts of meat, processed or cured meats, and poultry with skin. Fill about one quarter of your plate with lean proteins such as fish, chicken without skin, beans, eggs, and tofu. ? Avoid premade and processed foods. These tend to be higher in sodium, added sugar, and fat.  Reduce your daily sodium intake. Most people with hypertension should eat less than 1,500 mg of sodium a day.  Limit alcohol intake to no more than 1 drink a day for nonpregnant women and 2 drinks a day for men. One drink equals 12 oz of beer, 5 oz of wine, or 1 oz of hard liquor. Lifestyle  Work with your health care provider to maintain a healthy body weight, or to lose weight. Ask what an ideal weight is for you.  Get at least 30 minutes of exercise that causes your heart to beat faster (aerobic exercise) most days of the week. Activities may include walking, swimming, or biking.  Include exercise   to strengthen your muscles (resistance exercise), such as weight lifting, as part of your weekly exercise routine. Try to do these types of exercises for 30 minutes at least 3 days a week.  Do not use any products that contain nicotine or tobacco, such as cigarettes and e-cigarettes. If you need help quitting, ask  your health care provider.  Control any long-term (chronic) conditions you have, such as high cholesterol or diabetes. Monitoring  Monitor your blood pressure at home as told by your health care provider. Your personal target blood pressure may vary depending on your medical conditions, your age, and other factors.  Have your blood pressure checked regularly, as often as told by your health care provider. Working with your health care provider  Review all the medicines you take with your health care provider because there may be side effects or interactions.  Talk with your health care provider about your diet, exercise habits, and other lifestyle factors that may be contributing to hypertension.  Visit your health care provider regularly. Your health care provider can help you create and adjust your plan for managing hypertension. Will I need medicine to control my blood pressure? Your health care provider may prescribe medicine if lifestyle changes are not enough to get your blood pressure under control, and if:  Your systolic blood pressure is 130 or higher.  Your diastolic blood pressure is 80 or higher.  Take medicines only as told by your health care provider. Follow the directions carefully. Blood pressure medicines must be taken as prescribed. The medicine does not work as well when you skip doses. Skipping doses also puts you at risk for problems. Contact a health care provider if:  You think you are having a reaction to medicines you have taken.  You have repeated (recurrent) headaches.  You feel dizzy.  You have swelling in your ankles.  You have trouble with your vision. Get help right away if:  You develop a severe headache or confusion.  You have unusual weakness or numbness, or you feel faint.  You have severe pain in your chest or abdomen.  You vomit repeatedly.  You have trouble breathing. Summary  Hypertension is when the force of blood pumping through  your arteries is too strong. If this condition is not controlled, it may put you at risk for serious complications.  Your personal target blood pressure may vary depending on your medical conditions, your age, and other factors. For most people, a normal blood pressure is less than 120/80.  Hypertension is managed by lifestyle changes, medicines, or both. Lifestyle changes include weight loss, eating a healthy, low-sodium diet, exercising more, and limiting alcohol. This information is not intended to replace advice given to you by your health care provider. Make sure you discuss any questions you have with your health care provider. Document Released: 09/29/2011 Document Revised: 12/03/2015 Document Reviewed: 12/03/2015 Elsevier Interactive Patient Education  2018 [employer]   Heart-Healthy Eating Plan Heart-healthy meal planning includes:  Limiting unhealthy fats.  Increasing healthy fats.  Making other small dietary changes.  You may need to talk with your doctor or a diet specialist (dietitian) to create an eating plan that is right for you. What types of fat should I choose?  Choose healthy fats. These include olive oil and canola oil, flaxseeds, walnuts, almonds, and seeds.  Eat more omega-3 fats. These include salmon, mackerel, sardines, tuna, flaxseed oil, and ground flaxseeds. Try to eat fish at least twice each week.  Limit saturated  fats. ? Saturated fats are often found in animal products, such as meats, butter, and cream. ? Plant sources of saturated fats include palm oil, palm kernel oil, and coconut oil.  Avoid foods with partially hydrogenated oils in them. These include stick margarine, some tub margarines, cookies, crackers, and other baked goods. These contain trans fats. What general guidelines do I need to follow?  Check food labels carefully. Identify foods with trans fats or high amounts of saturated fat.  Fill one half of your plate with vegetables and  green salads. Eat 4-5 servings of vegetables per day. A serving of vegetables is: ? 1 cup of raw leafy vegetables. ?  cup of raw or cooked cut-up vegetables. ?  cup of vegetable juice.  Fill one fourth of your plate with whole grains. Look for the word "whole" as the first word in the ingredient list.  Fill one fourth of your plate with lean protein foods.  Eat 4-5 servings of fruit per day. A serving of fruit is: ? One medium whole fruit. ?  cup of dried fruit. ?  cup of fresh, frozen, or canned fruit. ?  cup of 100% fruit juice.  Eat more foods that contain soluble fiber. These include apples, broccoli, carrots, beans, peas, and barley. Try to get 20-30 g of fiber per day.  Eat more home-cooked food. Eat less restaurant, buffet, and fast food.  Limit or avoid alcohol.  Limit foods high in starch and sugar.  Avoid fried foods.  Avoid frying your food. Try baking, boiling, grilling, or broiling it instead. You can also reduce fat by: ? Removing the skin from poultry. ? Removing all visible fats from meats. ? Skimming the fat off of stews, soups, and gravies before serving them. ? Steaming vegetables in water or broth.  Lose weight if you are overweight.  Eat 4-5 servings of nuts, legumes, and seeds per week: ? One serving of dried beans or legumes equals  cup after being cooked. ? One serving of nuts equals 1 ounces. ? One serving of seeds equals  ounce or one tablespoon.  You may need to keep track of how much salt or sodium you eat. This is especially true if you have high blood pressure. Talk with your doctor or dietitian to get more information. What foods can I eat? Grains Breads, including [place], white, pita, wheat, raisin, rye, oatmeal, and [place]. Tortillas that are neither fried nor made with lard or trans fat. Low-fat rolls, including hotdog and hamburger buns and English muffins. Biscuits. Muffins. Waffles. Pancakes. Light popcorn. Whole-grain cereals.  Flatbread. Melba toast. Pretzels. Breadsticks. Rusks. Low-fat snacks. Low-fat crackers, including oyster, saltine, matzo, graham, animal, and rye. Rice and pasta, including brown rice and pastas that are made with whole wheat. Vegetables All vegetables. Fruits All fruits, but limit coconut. Meats and Other Protein Sources Lean, well-trimmed beef, veal, pork, and lamb. Chicken and [place] without skin. All fish and shellfish. Wild duck, rabbit, pheasant, and venison. Egg whites or low-cholesterol egg substitutes. Dried beans, peas, lentils, and tofu. Seeds and most nuts. Dairy Low-fat or nonfat cheeses, including ricotta, string, and mozzarella. Skim or 1% milk that is liquid, powdered, or evaporated. Buttermilk that is made with low-fat milk. Nonfat or low-fat yogurt. Beverages Mineral water. Diet carbonated beverages. Sweets and Desserts Sherbets and fruit ices. Honey, jam, marmalade, jelly, and syrups. Meringues and gelatins. Pure sugar candy, such as hard candy, jelly beans, gumdrops, mints, marshmallows, and small amounts of dark chocolate. [employer]  cake. Eat all sweets and desserts in moderation. Fats and Oils Nonhydrogenated (trans-free) margarines. Vegetable oils, including soybean, sesame, sunflower, olive, peanut, safflower, corn, canola, and cottonseed. Salad dressings or mayonnaise made with a vegetable oil. Limit added fats and oils that you use for cooking, baking, salads, and as spreads. Other Cocoa powder. Coffee and tea. All seasonings and condiments. The items listed above may not be a complete list of recommended foods or beverages. Contact your dietitian for more options. What foods are not recommended? Grains Breads that are made with saturated or trans fats, oils, or whole milk. Croissants. Butter rolls. Cheese breads. Sweet rolls. Donuts. Buttered popcorn. Chow mein noodles. High-fat crackers, such as cheese or butter crackers. Meats and Other Protein Sources Fatty  meats, such as hotdogs, short ribs, sausage, spareribs, bacon, rib eye roast or steak, and mutton. High-fat deli meats, such as salami and bologna. Caviar. Domestic duck and goose. Organ meats, such as kidney, liver, sweetbreads, and heart. Dairy Cream, sour cream, cream cheese, and creamed cottage cheese. Whole-milk cheeses, including blue (bleu), [address], [place], [place], [address], [place], [address], cheddar, [place], and [place]. Whole or 2% milk that is liquid, evaporated, or condensed. Whole buttermilk. Cream sauce or high-fat cheese sauce. Yogurt that is made from whole milk. Beverages Regular sodas and juice drinks with added sugar. Sweets and Desserts Frosting. Pudding. Cookies. Cakes other than angel food cake. Candy that has milk chocolate or white chocolate, hydrogenated fat, butter, coconut, or unknown ingredients. Buttered syrups. Full-fat ice cream or ice cream drinks. Fats and Oils Gravy that has suet, meat fat, or shortening. Cocoa butter, hydrogenated oils, palm oil, coconut oil, palm kernel oil. These can often be found in baked products, candy, fried foods, nondairy creamers, and whipped toppings. Solid fats and shortenings, including bacon fat, salt pork, lard, and butter. Nondairy cream substitutes, such as coffee creamers and sour cream substitutes. Salad dressings that are made of unknown oils, cheese, or sour cream. The items listed above may not be a complete list of foods and beverages to avoid. Contact your dietitian for more information. This information is not intended to replace advice given to you by your health care provider. Make sure you discuss any questions you have with your health care provider. Document Released: 07/06/2011 Document Revised: 06/12/2015 Document Reviewed: 06/28/2013 Elsevier Interactive Patient Education  [employer].

## 2017-01-31 NOTE — Progress Notes (Signed)
Subjective:  [name], male    DOB: December 07, 1970  Age: 47 y.o. MRN: [id number]  CC: Follow-up   HPI Dave Jimenez presents for history of hypertension and hyperlipidemia. He is exercising and is adherent to low salt diet. He reports checking BP 4 to 5 x's weekly. Readings 120's SBP and 60's DBP.  He reports running to car to get his cell phone prior to office visit. He is  taking antihypertensives daily. Cardiac symptoms none. Cardiovascular risk factors: hypertension, male gender, sedentary lifestyle and smoking/ tobacco exposure. He reports smoking one "black n' mild" cigar on some days.he is not ready to quit at this time. Use of agents associated with hypertension: NSAID'S. History of target organ damage: none.  History of lumbar back pain from history of fall. Most recent MRI that showed renal mass and laparoscopic radical nephrectomy performed to the right kidney. He has since followed up with specialists and completed PT. He denies any abdominal pain, dysuria, or hematuria.     Outpatient Medications Prior to Visit  Medication Sig Dispense Refill  . acetaminophen (TYLENOL) 500 MG tablet Take 1 tablet (500 mg total) by mouth every 6 (six) hours as needed. 100 tablet 2  . hydrochlorothiazide (HYDRODIURIL) 25 MG tablet TAKE ONE-HALF BY MOUTH ONCE DAILY 30 tablet 0  . methocarbamol (ROBAXIN) 500 MG tablet Take 1 tablet (500 mg total) by mouth 3 (three) times daily. X 10 days then prn muscle spasm 90 tablet 0  . pravastatin (PRAVACHOL) 20 MG tablet TAKE 1 TABLET(20 MG) BY MOUTH DAILY 30 tablet 0  . ranitidine (ZANTAC) 75 MG tablet Take 75 mg by mouth daily as needed for heartburn.    . traMADol (ULTRAM) 50 MG tablet Take 1-2 tablets (50-100 mg total) by mouth every 6 (six) hours as needed for moderate pain. 20 tablet 0   No facility-administered medications prior to visit.     ROS Review of Systems  Constitutional: Negative.   Respiratory: Negative.   Cardiovascular:  Negative.   Gastrointestinal: Negative.   Genitourinary: Negative for dysuria and hematuria.  Musculoskeletal: Negative for myalgias.  Neurological: Negative for syncope.  Psychiatric/Behavioral: Negative.    Objective:  BP 137/87 (BP Location: Right Arm, Patient Position: Sitting, Cuff Size: Normal)   Pulse 66   Temp 98.7 F (37.1 C) (Oral)   Resp 18   Ht 5\' 7"  (1.702 m)   Wt 182 lb (82.6 kg)   SpO2 99%   BMI 28.51 kg/m   BP/Weight 01/31/2017 12/02/2016 10/29/2016  Systolic BP 137 129 107  Diastolic BP 87 85 67  Wt. (Lbs) 182 176 173.6  BMI 28.51 27.16 27.19    Physical Exam  Constitutional: He appears well-developed and well-nourished.  Eyes: Pupils are equal, round, and reactive to light.  Neck: No JVD present.  Cardiovascular: Normal rate, regular rhythm, normal heart sounds and intact distal pulses.  Pulmonary/Chest: Effort normal and breath sounds normal.  Abdominal: Soft. Bowel sounds are normal. There is no tenderness.  Skin: Skin is warm and dry.  Psychiatric: His mood appears anxious. He expresses no homicidal and no suicidal ideation. He expresses no suicidal plans and no homicidal plans. He is communicative. He is attentive.  Nursing note and vitals reviewed.  Assessment & Plan:   1. Essential hypertension  - CMP and Liver - Lipid Panel  2. Mixed hyperlipidemia  - CMP and Liver - Lipid Panel    Follow-up: Return in about 3 months (around 05/01/2017) for HTN/HLD.  Lizbeth BarkMandesia R Hairston FNP

## 2017-02-01 LAB — CMP AND LIVER
ALT: 30 IU/L (ref 0–44)
AST: 24 IU/L (ref 0–40)
Albumin: 4.5 g/dL (ref 3.5–5.5)
Alkaline Phosphatase: 53 IU/L (ref 39–117)
BUN: 20 mg/dL (ref 6–24)
Bilirubin Total: 0.3 mg/dL (ref 0.0–1.2)
Bilirubin, Direct: 0.07 mg/dL (ref 0.00–0.40)
CALCIUM: 10 mg/dL (ref 8.7–10.2)
CHLORIDE: 102 mmol/L (ref 96–106)
CO2: 27 mmol/L (ref 20–29)
CREATININE: 1.61 mg/dL — AB (ref 0.76–1.27)
GFR, EST AFRICAN AMERICAN: 58 mL/min/{1.73_m2} — AB (ref >59)
GFR, EST NON AFRICAN AMERICAN: 50 mL/min/{1.73_m2} — AB (ref >59)
GLUCOSE: 101 mg/dL — AB (ref 65–99)
Potassium: 4.3 mmol/L (ref 3.5–5.2)
Sodium: 142 mmol/L (ref 134–144)
Total Protein: 7.2 g/dL (ref 6.0–8.5)

## 2017-02-01 LAB — LIPID PANEL
CHOL/HDL RATIO: 5.9 ratio — AB (ref 0.0–5.0)
Cholesterol, Total: 265 mg/dL — ABNORMAL HIGH (ref 100–199)
HDL: 45 mg/dL (ref >39)
LDL Calculated: 183 mg/dL — ABNORMAL HIGH (ref 0–99)
TRIGLYCERIDES: 186 mg/dL — AB (ref 0–149)
VLDL Cholesterol Cal: 37 mg/dL (ref 5–40)

## 2017-02-02 ENCOUNTER — Ambulatory Visit (INDEPENDENT_AMBULATORY_CARE_PROVIDER_SITE_OTHER): Payer: Managed Care, Other (non HMO) | Admitting: Specialist

## 2017-02-02 ENCOUNTER — Telehealth (INDEPENDENT_AMBULATORY_CARE_PROVIDER_SITE_OTHER): Payer: Self-pay | Admitting: Specialist

## 2017-02-02 ENCOUNTER — Encounter (INDEPENDENT_AMBULATORY_CARE_PROVIDER_SITE_OTHER): Payer: Self-pay | Admitting: Specialist

## 2017-02-02 VITALS — BP 131/88 | HR 70 | Ht 67.5 in | Wt 176.0 lb

## 2017-02-02 DIAGNOSIS — M5136 Other intervertebral disc degeneration, lumbar region: Secondary | ICD-10-CM | POA: Diagnosis not present

## 2017-02-02 MED ORDER — TRAMADOL HCL 50 MG PO TABS: 50.0000 mg | ORAL_TABLET | Freq: Four times a day (QID) | ORAL | 0 refills | Status: DC | PRN

## 2017-02-02 NOTE — Patient Instructions (Signed)
Avoid frequent bending and stooping  No lifting greater than 10 lbs. May use ice or moist heat for pain. Weight loss is of benefit.   

## 2017-02-02 NOTE — Progress Notes (Signed)
Office Visit Note   Patient: Dave MelterSidney Jimenez           Date of Birth: 07/21/1970           MRN: [id number] Visit Date: 02/02/2017              Requested by: Lizbeth BarkHairston, Mandesia R, FNP [address] [place], [place] [id number] PCP: Lizbeth BarkHairston, Mandesia R, FNP   Assessment & Plan: Visit Diagnoses:  1. Degenerative disc disease, lumbar     Plan: Avoid frequent bending and stooping  No lifting greater than 10 lbs. May use ice or moist heat for pain. Weight loss is of benefit.  Follow-Up Instructions: Return in about 4 weeks (around 03/02/2017).   Orders:  No orders of the defined types were placed in this encounter.  No orders of the defined types were placed in this encounter.     Procedures: No procedures performed   Clinical Data: No additional findings.   Subjective: Chief Complaint  Patient presents with  . Lower Back - Follow-up    47 year old male post nephrectomy for renal mass, negative for cancer. He is working at the [employer] [employer] [facility]. 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 He has his days, getting up and especially with long work day he notices stiffness andlower back pain. No bowel or bladder difficullties.     Review of Systems  Constitutional: Negative.   HENT: Negative.   Eyes: Negative.   Respiratory: Negative.   Cardiovascular: Negative.   Gastrointestinal: Negative.   Endocrine: Negative.   Genitourinary: Negative.   Musculoskeletal: Negative.   Skin: Negative.   Allergic/Immunologic: Negative.   Neurological: Negative.   Hematological: Negative.   Psychiatric/Behavioral: Negative.      Objective: Vital Signs: BP 131/88 (BP Location: Left Arm, Patient Position: Sitting)   Pulse 70   Ht 5' 7.5" (1.715 m)   Wt 176 lb (79.8 kg)   BMI 27.16 kg/m   Physical Exam  Constitutional: He is oriented to person, place, and time. He  appears well-developed and well-nourished.  HENT:  Head: Normocephalic and atraumatic.  Eyes: EOM are normal. Pupils are equal, round, and reactive to light.  Neck: Normal range of motion. Neck supple.  Pulmonary/Chest: Effort normal and breath sounds normal.  Abdominal: Soft. Bowel sounds are normal.  Neurological: He is alert and oriented to person, place, and time.  Skin: Skin is warm and dry.  Psychiatric: He has a normal mood and affect. His behavior is normal. Judgment and thought content normal.    Back Exam   Tenderness  The patient is experiencing tenderness in the lumbar.  Range of Motion  Extension: abnormal  Flexion: normal  Lateral bend right: normal  Lateral bend left: normal  Rotation right: normal  Rotation left: normal   Muscle Strength  Right Quadriceps:  5/5  Left Quadriceps:  5/5  Right Hamstrings:  5/5  Left Hamstrings:  5/5   Tests  Straight leg raise right: negative Straight leg raise left: negative  Reflexes  Patellar: normal Achilles: normal Biceps: normal Babinski's sign: normal   Other  Toe walk: normal Heel walk: normal Sensation: normal Gait: normal  Erythema: no back redness Scars: present      Specialty Comments:  No specialty comments available.  Imaging: No results found.   PMFS History: Patient Active Problem List   Diagnosis Date Noted  . Mixed  hyperlipidemia 11/17/2016  . History of nephrectomy, unilateral 10/27/2016  . DDD (degenerative disc disease), lumbar 10/27/2016  . Renal mass 09/29/2016   Past Medical History:  Diagnosis Date  . Chronic kidney disease   . Hypertension     Family History  Problem Relation Age of Onset  . Hypertension Mother   . Heart disease Father     Past Surgical History:  Procedure Laterality Date  . LAPAROSCOPIC NEPHRECTOMY Right 09/29/2016   Procedure: LAPAROSCOPIC RADICAL  NEPHRECTOMY RIGHT;  Surgeon: Crist Fat, MD;  Location: [facility];  Service: Urology;   Laterality: Right;  . MASTECTOMY    . MASTECTOMY  1989   Social History   Occupational History    Employer: [employer]    Comment: CNA  Tobacco Use  . Smoking status: Current Every Day Smoker    Types: Cigars  . Smokeless tobacco: Never Used  Substance and Sexual Activity  . Alcohol use: Yes    Alcohol/week: 1.2 oz    Types: 2 Cans of beer per week    Comment: daily  . Drug use: Yes    Types: Marijuana    Comment: this past Monday  . Sexual activity: Not on file

## 2017-02-02 NOTE — Addendum Note (Signed)
Addended by: Vira BrownsNITKA, Bayle Calvo on: 02/02/2017 01:27 PM   Modules accepted: Orders

## 2017-02-02 NOTE — Telephone Encounter (Signed)
Patient needs a work note stating that he can return to work with no restrictions.  Thank you.

## 2017-02-04 NOTE — Telephone Encounter (Signed)
Patient needs a work note stating that he can return to work with no restrictions.

## 2017-02-07 ENCOUNTER — Telehealth (INDEPENDENT_AMBULATORY_CARE_PROVIDER_SITE_OTHER): Payer: Self-pay | Admitting: Specialist

## 2017-02-07 ENCOUNTER — Encounter (INDEPENDENT_AMBULATORY_CARE_PROVIDER_SITE_OTHER): Payer: Self-pay | Admitting: Radiology

## 2017-02-07 NOTE — Telephone Encounter (Signed)
Patient called and stated that he needs letter changed from 40lbs of restrictions so he can go back to work. Wants to see if MD can rewrite today so he can take to his job.  Please call paient to advise

## 2017-02-07 NOTE — Telephone Encounter (Signed)
Note was rewritten for pt and pt is aware the note is ready for pick up at the front desk

## 2017-02-09 ENCOUNTER — Other Ambulatory Visit: Payer: Self-pay | Admitting: Family Medicine

## 2017-02-09 DIAGNOSIS — R944 Abnormal results of kidney function studies: Secondary | ICD-10-CM | POA: Insufficient documentation

## 2017-02-09 DIAGNOSIS — I10 Essential (primary) hypertension: Secondary | ICD-10-CM | POA: Insufficient documentation

## 2017-02-09 MED ORDER — AMLODIPINE BESYLATE 5 MG PO TABS: 5.0000 mg | ORAL_TABLET | Freq: Every day | ORAL | 2 refills | Status: DC

## 2017-02-14 ENCOUNTER — Telehealth (INDEPENDENT_AMBULATORY_CARE_PROVIDER_SITE_OTHER): Payer: Self-pay | Admitting: *Deleted

## 2017-02-14 NOTE — Telephone Encounter (Signed)
-----   Message from Lizbeth BarkMandesia R Hairston, FNP sent at 02/09/2017 10:13 AM EST ----- Kidney function decreased. D/c hydrochlorothiazide. You will be placed on amlodipine. Continue to take your medications for blood pressure, avoid taking NSAID medications, reduce salt intake to 2 to 4 grams/day, do not smoke. Recommend monitoring again in 3 months. If your levels have signifcantly increased you will be referred to nephrology.

## 2017-02-14 NOTE — Telephone Encounter (Signed)
Patient verified DOB Patient is aware of kidney function showing a decrease. Patient is aware of stopping the HCTZ and beginning the amlodipine. Patient is also advised to avoid NSAID's, Smoking (black-n-milds), and reduce salt intake.  Patient is aware of nephrology referral being placed in improvement is not noted in 3 month recheck. Patient states he will take the Norvasc for a week and ensure headaches are not present. He complains of this side effect when he took the medication historically. Patient advised to contact the office a persistent HA's begins after taking for one week.

## 2017-04-21 ENCOUNTER — Ambulatory Visit: Payer: Managed Care, Other (non HMO) | Admitting: Family Medicine

## 2017-04-22 ENCOUNTER — Ambulatory Visit: Payer: Managed Care, Other (non HMO) | Admitting: Family Medicine

## 2017-05-27 ENCOUNTER — Ambulatory Visit (INDEPENDENT_AMBULATORY_CARE_PROVIDER_SITE_OTHER): Payer: Self-pay | Admitting: Specialist

## 2017-05-27 ENCOUNTER — Encounter (INDEPENDENT_AMBULATORY_CARE_PROVIDER_SITE_OTHER): Payer: Self-pay | Admitting: Specialist

## 2017-05-27 VITALS — BP 136/94 | HR 77 | Ht 67.5 in | Wt 176.0 lb

## 2017-05-27 DIAGNOSIS — M545 Low back pain, unspecified: Secondary | ICD-10-CM

## 2017-05-27 DIAGNOSIS — S335XXA Sprain of ligaments of lumbar spine, initial encounter: Secondary | ICD-10-CM

## 2017-05-27 DIAGNOSIS — M5136 Other intervertebral disc degeneration, lumbar region: Secondary | ICD-10-CM

## 2017-05-27 DIAGNOSIS — M5126 Other intervertebral disc displacement, lumbar region: Secondary | ICD-10-CM

## 2017-05-27 MED ORDER — METHOCARBAMOL 500 MG PO TABS: 500.0000 mg | ORAL_TABLET | Freq: Three times a day (TID) | ORAL | 0 refills | Status: DC

## 2017-05-27 MED ORDER — TRAMADOL HCL 50 MG PO TABS: 50.0000 mg | ORAL_TABLET | Freq: Four times a day (QID) | ORAL | 0 refills | Status: DC | PRN

## 2017-05-27 NOTE — Patient Instructions (Signed)
Avoid frequent bending and stooping  No lifting greater than 10 lbs. May use ice or moist heat for pain. Weight loss is of benefit.   

## 2017-05-27 NOTE — Progress Notes (Signed)
Office Visit Note   Patient: Dave Jimenez           Date of Birth: 1970/02/13           MRN: [id number] Visit Date: 05/27/2017              Requested by: Lizbeth Bark, FNP No address on file PCP: Lizbeth Bark, FNP   Assessment & Plan: Visit Diagnoses:  1. Degenerative disc disease, lumbar   2. Other intervertebral disc degeneration, lumbar region   3. Herniation of lumbar intervertebral disc   4. Lumbar sprain, initial encounter   5. Acute midline low back pain without sciatica     Plan: Avoid frequent bending and stooping  No lifting greater than 10 lbs. May use ice or moist heat for pain. Weight loss is of benefit.  Follow-Up Instructions: Return in about 6 months (around 11/27/2017).   Orders:  No orders of the defined types were placed in this encounter.  Meds ordered this encounter  Medications  . traMADol (ULTRAM) 50 MG tablet    Sig: Take 1-2 tablets (50-100 mg total) by mouth every 6 (six) hours as needed for moderate pain.    Dispense:  20 tablet    Refill:  0  . methocarbamol (ROBAXIN) 500 MG tablet    Sig: Take 1 tablet (500 mg total) by mouth 3 (three) times daily. X 10 days then prn muscle spasm    Dispense:  90 tablet    Refill:  0      Procedures: No procedures performed   Clinical Data: No additional findings.   Subjective: Chief Complaint  Patient presents with  . Lower Back - Follow-up    47 year old male with low back pain with right posterior thigh and calf sciatica. He has an MRI from 06/2016. Findings of DDD L2-3 through L5-S1 and he was experiencing back pain prior the MRI and the study from 06/2016 showed DDD that is diffuse and mass in the right kidney. He underwent a nephrectomy and the results of the pathology show no cancer. He fell and began having low back pain and is asking if the fall may have caused the findings within the kidney which he reports was felt to represent an organized hematoma. He  experiences Back pain right side and has intermittant pain into the right posterior buttock, sciatica is happening with some  Brisk walking and jog yesterday. Today he reports he is feeling pain into the right leg.   Review of Systems  Constitutional: Negative.   HENT: Negative.   Eyes: Negative.   Respiratory: Negative.   Cardiovascular: Negative.   Gastrointestinal: Negative.   Endocrine: Negative.   Genitourinary: Negative.   Musculoskeletal: Negative.   Skin: Negative.   Allergic/Immunologic: Negative.   Neurological: Negative.   Hematological: Negative.   Psychiatric/Behavioral: Negative.      Objective: Vital Signs: BP (!) 136/94 (BP Location: Left Arm, Patient Position: Sitting)   Pulse 77   Ht 5' 7.5" (1.715 m)   Wt 176 lb (79.8 kg)   BMI 27.16 kg/m   Physical Exam  Constitutional: He is oriented to person, place, and time. He appears well-developed and well-nourished.  HENT:  Head: Normocephalic and atraumatic.  Eyes: Pupils are equal, round, and reactive to light. EOM are normal.  Neck: Normal range of motion. Neck supple.  Pulmonary/Chest: Effort normal and breath sounds normal.  Abdominal: Soft. Bowel sounds are normal.  Neurological: He is alert and oriented to person,  place, and time.  Skin: Skin is warm and dry.  Psychiatric: He has a normal mood and affect. His behavior is normal. Judgment and thought content normal.    Back Exam   Tenderness  The patient is experiencing tenderness in the lumbar.  Range of Motion  Extension:  20 abnormal  Flexion:  80 normal  Lateral bend right: abnormal  Lateral bend left: abnormal  Rotation right: normal  Rotation left: normal   Muscle Strength  Right Quadriceps:  5/5  Left Quadriceps:  5/5  Right Hamstrings:  5/5  Left Hamstrings:  5/5   Tests  Straight leg raise right: negative Straight leg raise left: negative  Reflexes  Patellar: normal Achilles: normal Babinski's sign: normal   Other  Toe  walk: normal Heel walk: normal Sensation: normal Gait: normal  Erythema: no back redness Scars: absent      Specialty Comments:  No specialty comments available.  Imaging: No results found.   PMFS History: Patient Active Problem List   Diagnosis Date Noted  . Essential hypertension 02/09/2017  . Decreased calculated GFR 02/09/2017  . Mixed hyperlipidemia 11/17/2016  . History of nephrectomy, unilateral 10/27/2016  . DDD (degenerative disc disease), lumbar 10/27/2016  . Renal mass 09/29/2016   Past Medical History:  Diagnosis Date  . Chronic kidney disease   . Hypertension     Family History  Problem Relation Age of Onset  . Hypertension Mother   . Heart disease Father     Past Surgical History:  Procedure Laterality Date  . LAPAROSCOPIC NEPHRECTOMY Right 09/29/2016   Procedure: LAPAROSCOPIC RADICAL  NEPHRECTOMY RIGHT;  Surgeon: Crist Fat, MD;  Location: [facility];  Service: Urology;  Laterality: Right;  . MASTECTOMY    . MASTECTOMY  1989   Social History   Occupational History    Employer: [employer]    Comment: CNA  Tobacco Use  . Smoking status: Current Every Day Smoker    Types: Cigars  . Smokeless tobacco: Never Used  Substance and Sexual Activity  . Alcohol use: Yes    Alcohol/week: 1.2 oz    Types: 2 Cans of beer per week    Comment: daily  . Drug use: Yes    Types: Marijuana    Comment: this past Monday  . Sexual activity: Not on file

## 2017-06-03 ENCOUNTER — Encounter: Payer: Self-pay | Admitting: Family Medicine

## 2017-06-03 ENCOUNTER — Ambulatory Visit: Payer: Managed Care, Other (non HMO) | Attending: Family Medicine | Admitting: Family Medicine

## 2017-06-03 VITALS — BP 135/94 | HR 74 | Temp 98.0°F | Resp 16 | Ht 67.0 in | Wt 180.4 lb

## 2017-06-03 DIAGNOSIS — N189 Chronic kidney disease, unspecified: Secondary | ICD-10-CM | POA: Insufficient documentation

## 2017-06-03 DIAGNOSIS — M5126 Other intervertebral disc displacement, lumbar region: Secondary | ICD-10-CM | POA: Diagnosis not present

## 2017-06-03 DIAGNOSIS — E782 Mixed hyperlipidemia: Secondary | ICD-10-CM | POA: Insufficient documentation

## 2017-06-03 DIAGNOSIS — Z905 Acquired absence of kidney: Secondary | ICD-10-CM | POA: Diagnosis not present

## 2017-06-03 DIAGNOSIS — G8929 Other chronic pain: Secondary | ICD-10-CM | POA: Diagnosis not present

## 2017-06-03 DIAGNOSIS — I10 Essential (primary) hypertension: Secondary | ICD-10-CM | POA: Diagnosis not present

## 2017-06-03 DIAGNOSIS — I129 Hypertensive chronic kidney disease with stage 1 through stage 4 chronic kidney disease, or unspecified chronic kidney disease: Secondary | ICD-10-CM | POA: Diagnosis not present

## 2017-06-03 DIAGNOSIS — M5136 Other intervertebral disc degeneration, lumbar region: Secondary | ICD-10-CM | POA: Diagnosis not present

## 2017-06-03 DIAGNOSIS — Z79899 Other long term (current) drug therapy: Secondary | ICD-10-CM | POA: Diagnosis not present

## 2017-06-03 MED ORDER — PRAVASTATIN SODIUM 20 MG PO TABS: 20.0000 mg | ORAL_TABLET | Freq: Every day | ORAL | 6 refills | Status: DC

## 2017-06-03 MED ORDER — AMLODIPINE BESYLATE 5 MG PO TABS
5.0000 mg | ORAL_TABLET | Freq: Every day | ORAL | 6 refills | Status: DC
Start: 2017-06-03 — End: 2017-09-12

## 2017-06-03 NOTE — Progress Notes (Signed)
Subjective:  [name], male    DOB: 1970/03/18  Age: 47 y.o. MRN: [id number]  CC: Establish Care and Medication Refill   HPI Dave Jimenez is a 47 year old male with a history of hypertension, hyperlipidemia, chronic low back pain due to degenerative disc disease, herniated lumbar disc who presents today to establish care with me as he was previously followed by condition Jenelle Mages, FNP. He has been out of his antihypertensive and his pravastatin for the last 1 month and is requesting refills. He does have chronic low back pain for which he wears a brace.  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 Seen by orthopedics, Dr. Otelia Sergeant on 05/27/2017 advised to avoid lifting greater than 10 pounds he is unable to quit his job as he needs the money to pay for child support and also for his basic needs.  He requests a note to return to work tomorrow rather than today so he can rest.  Past Medical History:  Diagnosis Date  . Chronic kidney disease    kidney issue  . Hypertension     Past Surgical History:  Procedure Laterality Date  . LAPAROSCOPIC NEPHRECTOMY Right 09/29/2016   Procedure: LAPAROSCOPIC RADICAL  NEPHRECTOMY RIGHT;  Surgeon: Crist Fat, MD;  Location: [facility];  Service: Urology;  Laterality: Right;  . MASTECTOMY    . MASTECTOMY  1989    No Known Allergies   Outpatient Medications Prior to Visit  Medication Sig Dispense Refill  . methocarbamol (ROBAXIN) 500 MG tablet Take 1 tablet (500 mg total) by mouth 3 (three) times daily. X 10 days then prn muscle spasm 90 tablet 0  . traMADol (ULTRAM) 50 MG tablet Take 1 tablet (50 mg total) by mouth every 6 (six) hours as needed. 40 tablet 0  . amLODipine (NORVASC) 5 MG tablet Take 1 tablet (5 mg total) by mouth daily. 30 tablet 2  . pravastatin (PRAVACHOL) 20 MG tablet TAKE 1 TABLET(20 MG) BY MOUTH DAILY  30 tablet 0  . acetaminophen (TYLENOL) 500 MG tablet Take 1 tablet (500 mg total) by mouth every 6 (six) hours as needed. (Patient not taking: Reported on 06/03/2017) 100 tablet 2  . ranitidine (ZANTAC) 75 MG tablet Take 75 mg by mouth daily as needed for heartburn.    . traMADol (ULTRAM) 50 MG tablet Take 1-2 tablets (50-100 mg total) by mouth every 6 (six) hours as needed for moderate pain. (Patient not taking: Reported on 06/03/2017) 20 tablet 0   No facility-administered medications prior to visit.     ROS Review of Systems  Constitutional: Negative for activity change and appetite change.  HENT: Negative for sinus pressure and sore throat.   Eyes: Negative for visual disturbance.  Respiratory: Negative for cough, chest tightness and shortness of breath.   Cardiovascular: Negative for chest pain and leg swelling.  Gastrointestinal: Negative for abdominal distention, abdominal pain, constipation and diarrhea.  Endocrine: Negative.   Genitourinary: Negative for dysuria.  Musculoskeletal: Positive for back pain. Negative for joint swelling and myalgias.  Skin: Negative for rash.  Allergic/Immunologic: Negative.   Neurological: Negative for weakness, light-headedness and numbness.  Psychiatric/Behavioral: Negative for dysphoric mood and suicidal ideas.    Objective:  BP (!) 135/94 (BP Location: Right Arm, Patient Position: Sitting, Cuff Size: Normal)   Pulse 74   Temp 98 F (36.7 C) (Oral)  Resp 16   Ht  (1.702 m)   Wt 180 lb 6.4 oz (81.8 kg)   SpO2 98%   BMI 28.25 kg/m   BP/Weight 06/03/2017 05/27/2017 02/02/2017  Systolic BP 135 136 131  Diastolic BP 94 94 88  Wt. (Lbs) 180.4 176 176  BMI 28.25 27.16 27.16    Physical Exam  Constitutional: He is oriented to person, place, and time. He appears well-developed and well-nourished.  Cardiovascular: Normal rate, normal heart sounds and intact distal pulses.  No murmur heard. Pulmonary/Chest: Effort normal and breath  sounds normal. He has no wheezes. He has no rales. He exhibits no tenderness.  Abdominal: Soft. Bowel sounds are normal. He exhibits no distension and no mass. There is no tenderness.  Musculoskeletal: He exhibits tenderness (TTP of b/l paraspinal lumbar muscles,negative straight leg raise b/l).  Neurological: He is alert and oriented to person, place, and time.  Skin: Skin is warm and dry.  Psychiatric: He has a normal mood and affect.     CMP Latest Ref Rng & Units 01/31/2017 09/30/2016 09/23/2016  Glucose 65 - 99 mg/dL 696(E) 95 86  BUN 6 - 24 mg/dL Creatinine 0.76 - 1.27 mg/dL 9.52(W) 4.13(K) 4.40(N)  Sodium 134 - 144 mmol/L 142 139 141  Potassium 3.5 - 5.2 mmol/L 4.3 3.8 3.9  Chloride 96 - 106 mmol/L 102 106 107  CO2 20 - 29 mmol/L Calcium 8.7 - 10.2 mg/dL 02.7 2.5(D) 9.3  Total Protein 6.0 - 8.5 g/dL 7.2 - 6.5  Total Bilirubin 0.0 - 1.2 mg/dL 0.3 - 0.6  Alkaline Phos 39 - 117 IU/L 53 - 40  AST 0 - 40 IU/L 24 - 25  ALT 0 - 44 IU/L 30 - 29    Lipid Panel     Component Value Date/Time   CHOL 265 (H) 01/31/2017 1105   TRIG 186 (H) 01/31/2017 1105   HDL 45 01/31/2017 1105   CHOLHDL 5.9 (H) 01/31/2017 1105   LDLCALC 183 (H) 01/31/2017 1105    Assessment & Plan:   1. Essential hypertension Slightly elevated due to running out of antihypertensive which I have refilled Low sodium, DASH diet - amLODipine (NORVASC) 5 MG tablet; Take 1 tablet (5 mg total) by mouth daily.  Dispense: 30 tablet; Refill: 6  2. Mixed hyperlipidemia Uncontrolled, he has been out of his statin Low-cholesterol diet, lifestyle modifications - pravastatin (PRAVACHOL) 20 MG tablet; Take 1 tablet (20 mg total) by mouth daily.  Dispense: 30 tablet; Refill: 6  3. 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 Otelia Sergeant Note for work  provided.   Meds ordered this encounter  Medications  . amLODipine (NORVASC) 5 MG tablet    Sig: Take 1 tablet (5 mg total) by mouth daily.    Dispense:  30 tablet    Refill:  6  . pravastatin (PRAVACHOL) 20 MG tablet    Sig: Take 1 tablet (20 mg total) by mouth daily.    Dispense:  30 tablet    Refill:  6    Follow-up: Return in about 3 months (around 09/03/2017) for Follow-up of chronic medical conditions.   Hoy Register MD

## 2017-06-03 NOTE — Patient Instructions (Signed)

## 2017-06-03 NOTE — Progress Notes (Signed)
Pt. Is here to establish care. Pt. Need medication refill.

## 2017-09-12 ENCOUNTER — Ambulatory Visit: Payer: Managed Care, Other (non HMO) | Attending: Family Medicine | Admitting: Family Medicine

## 2017-09-12 ENCOUNTER — Encounter: Payer: Self-pay | Admitting: Family Medicine

## 2017-09-12 VITALS — BP 138/85 | HR 64 | Temp 98.5°F | Ht 67.0 in | Wt 178.0 lb

## 2017-09-12 DIAGNOSIS — M5136 Other intervertebral disc degeneration, lumbar region: Secondary | ICD-10-CM | POA: Diagnosis not present

## 2017-09-12 DIAGNOSIS — Z79899 Other long term (current) drug therapy: Secondary | ICD-10-CM | POA: Insufficient documentation

## 2017-09-12 DIAGNOSIS — N189 Chronic kidney disease, unspecified: Secondary | ICD-10-CM | POA: Insufficient documentation

## 2017-09-12 DIAGNOSIS — I129 Hypertensive chronic kidney disease with stage 1 through stage 4 chronic kidney disease, or unspecified chronic kidney disease: Secondary | ICD-10-CM | POA: Diagnosis not present

## 2017-09-12 DIAGNOSIS — G8929 Other chronic pain: Secondary | ICD-10-CM | POA: Insufficient documentation

## 2017-09-12 DIAGNOSIS — E782 Mixed hyperlipidemia: Secondary | ICD-10-CM | POA: Diagnosis not present

## 2017-09-12 DIAGNOSIS — I10 Essential (primary) hypertension: Secondary | ICD-10-CM

## 2017-09-12 DIAGNOSIS — Z905 Acquired absence of kidney: Secondary | ICD-10-CM | POA: Insufficient documentation

## 2017-09-12 MED ORDER — PRAVASTATIN SODIUM 20 MG PO TABS: 20.0000 mg | ORAL_TABLET | Freq: Every day | ORAL | 6 refills | Status: DC

## 2017-09-12 MED ORDER — AMLODIPINE BESYLATE 5 MG PO TABS: 5.0000 mg | ORAL_TABLET | Freq: Every day | ORAL | 6 refills | Status: DC

## 2017-09-12 NOTE — Patient Instructions (Signed)

## 2017-09-12 NOTE — Progress Notes (Signed)
Subjective:  [name], male    DOB: [birth year]  Age: 47 y.o. MRN: [id number]  CC: Hypertension   HPI Dave Jimenez is a 47 year old male with a history of hypertension, hyperlipidemia, chronic low back pain due to degenerative disc disease, herniated lumbar disc who presents today for follow-up visit. His back pain is described as mild to moderate but is exacerbated when he attempts to bend to pick a ball while playing basketball with his son or after prolonged sitting, quick movement.  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 Denies radiation of pain down his lower extremities. He has been compliant with his antihypertensive and his statin and denies adverse effects from his medications.  His last lipid panel revealed elevated cholesterol.  Past Medical History:  Diagnosis Date  . Chronic kidney disease    kidney issue  . Hypertension     Past Surgical History:  Procedure Laterality Date  . LAPAROSCOPIC NEPHRECTOMY Right 09/29/2016   Procedure: LAPAROSCOPIC RADICAL  NEPHRECTOMY RIGHT;  Surgeon: Ardis Hughs, MD;  Location: [facility];  Service: Urology;  Laterality: Right;  . MASTECTOMY    . MASTECTOMY  1989    No Known Allergies   Outpatient Medications Prior to Visit  Medication Sig Dispense Refill  . methocarbamol (ROBAXIN) 500 MG tablet Take 1 tablet (500 mg total) by mouth 3 (three) times daily. X 10 days then prn muscle spasm 90 tablet 0  . ranitidine (ZANTAC) 75 MG tablet Take 75 mg by mouth daily as needed for heartburn.    Marland Kitchen amLODipine (NORVASC) 5 MG tablet Take 1 tablet (5 mg total) by mouth daily. 30 tablet 6  . pravastatin (PRAVACHOL) 20 MG tablet Take 1 tablet (20 mg total) by mouth daily. 30 tablet 6  . acetaminophen (TYLENOL) 500 MG tablet Take 1 tablet (500 mg total) by mouth every 6 (six) hours as needed. (Patient not taking: Reported on 06/03/2017) 100  tablet 2  . traMADol (ULTRAM) 50 MG tablet Take 1 tablet (50 mg total) by mouth every 6 (six) hours as needed. 40 tablet 0  . traMADol (ULTRAM) 50 MG tablet Take 1-2 tablets (50-100 mg total) by mouth every 6 (six) hours as needed for moderate pain. (Patient not taking: Reported on 06/03/2017) 20 tablet 0   No facility-administered medications prior to visit.     ROS Review of Systems  Constitutional: Negative for activity change and appetite change.  HENT: Negative for sinus pressure and sore throat.   Eyes: Negative for visual disturbance.  Respiratory: Negative for cough, chest tightness and shortness of breath.   Cardiovascular: Negative for chest pain and leg swelling.  Gastrointestinal: Negative for abdominal distention, abdominal pain, constipation and diarrhea.  Endocrine: Negative.   Genitourinary: Negative for dysuria.  Musculoskeletal: Negative for joint swelling and myalgias.  Skin: Negative for rash.  Allergic/Immunologic: Negative.   Neurological: Negative for weakness, light-headedness and numbness.  Psychiatric/Behavioral: Negative for dysphoric mood and suicidal ideas.    Objective:  BP 138/85   Pulse 64   Temp 98.5 F (36.9 C) (Oral)   Ht 5' 7" (1.702 m)   Wt 178 lb (80.7 kg)   SpO2 98%   BMI 27.88 kg/m   BP/Weight 09/12/2017 06/03/2017 2/50/5397  Systolic BP 673 419 379  Diastolic BP 85 94 94  Wt. (Lbs) 178 180.4 176  BMI 27.88 28.25 27.16  Physical Exam  Constitutional: He is oriented to person, place, and time. He appears well-developed and well-nourished.  Cardiovascular: Normal rate, normal heart sounds and intact distal pulses.  No murmur heard. Pulmonary/Chest: Effort normal and breath sounds normal. He has no wheezes. He has no rales. He exhibits no tenderness.  Abdominal: Soft. Bowel sounds are normal. He exhibits no distension and no mass. There is no tenderness.  Musculoskeletal:  Tenderness across lumbar spine; negative straight leg  raise bilaterally  Neurological: He is alert and oriented to person, place, and time.  Skin: Skin is warm and dry.  Psychiatric: He has a normal mood and affect.    CMP Latest Ref Rng & Units 01/31/2017 09/30/2016 09/23/2016  Glucose 65 - 99 mg/dL 101(H) 95 86  BUN 6 - 24 mg/dL 20 11 13  Creatinine 0.76 - 1.27 mg/dL 1.61(H) 1.71(H) 1.30(H)  Sodium 134 - 144 mmol/L 142 139 141  Potassium 3.5 - 5.2 mmol/L 4.3 3.8 3.9  Chloride 96 - 106 mmol/L 102 106 107  CO2 20 - 29 mmol/L 27 28 27  Calcium 8.7 - 10.2 mg/dL 10.0 8.7(L) 9.3  Total Protein 6.0 - 8.5 g/dL 7.2 - 6.5  Total Bilirubin 0.0 - 1.2 mg/dL 0.3 - 0.6  Alkaline Phos 39 - 117 IU/L 53 - 40  AST 0 - 40 IU/L 24 - 25  ALT 0 - 44 IU/L 30 - 29    Lipid Panel     Component Value Date/Time   CHOL 265 (H) 01/31/2017 1105   TRIG 186 (H) 01/31/2017 1105   HDL 45 01/31/2017 1105   CHOLHDL 5.9 (H) 01/31/2017 1105   LDLCALC 183 (H) 01/31/2017 1105    Assessment & Plan:   1. Essential hypertension Controlled Counseled on blood pressure goal of less than 130/80, low-sodium, DASH diet, medication compliance, 150 minutes of moderate intensity exercise per week. Discussed medication compliance, adverse effects. - amLODipine (NORVASC) 5 MG tablet; Take 1 tablet (5 mg total) by mouth daily.  Dispense: 30 tablet; Refill: 6 - CMP14+EGFR  2. Mixed hyperlipidemia Uncontrolled We will send of lipid panel and adjust pravastatin accordingly Low-cholesterol diet - Lipid panel - pravastatin (PRAVACHOL) 20 MG tablet; Take 1 tablet (20 mg total) by mouth daily.  Dispense: 30 tablet; Refill: 6  3. DDD (degenerative disc disease), lumbar Currently on Robaxin and tramadol as needed as provided by his orthopedic-Dr. Nitka Emphasized correct body posture with regards to lifting Continue home physical therapy   Meds ordered this encounter  Medications  . amLODipine (NORVASC) 5 MG tablet    Sig: Take 1 tablet (5 mg total) by mouth daily.    Dispense:   30 tablet    Refill:  6  . pravastatin (PRAVACHOL) 20 MG tablet    Sig: Take 1 tablet (20 mg total) by mouth daily.    Dispense:  30 tablet    Refill:  6    Follow-up: Return in about 6 months (around 03/15/2018) for follow up of chronic medical conditions.   Enobong Newlin MD   

## 2017-09-13 ENCOUNTER — Other Ambulatory Visit: Payer: Self-pay | Admitting: Family Medicine

## 2017-09-13 DIAGNOSIS — E782 Mixed hyperlipidemia: Secondary | ICD-10-CM

## 2017-09-13 LAB — LIPID PANEL
Chol/HDL Ratio: 6 ratio — ABNORMAL HIGH (ref 0.0–5.0)
Cholesterol, Total: 253 mg/dL — ABNORMAL HIGH (ref 100–199)
HDL: 42 mg/dL (ref >39)
LDL Calculated: 168 mg/dL — ABNORMAL HIGH (ref 0–99)
Triglycerides: 216 mg/dL — ABNORMAL HIGH (ref 0–149)
VLDL Cholesterol Cal: 43 mg/dL — ABNORMAL HIGH (ref 5–40)

## 2017-09-13 LAB — CMP14+EGFR
A/G RATIO: 1.7 (ref 1.2–2.2)
ALT: 24 IU/L (ref 0–44)
AST: 22 IU/L (ref 0–40)
Albumin: 4.5 g/dL (ref 3.5–5.5)
Alkaline Phosphatase: 59 IU/L (ref 39–117)
BUN/Creatinine Ratio: 10 (ref 9–20)
BUN: 17 mg/dL (ref 6–24)
Bilirubin Total: 0.4 mg/dL (ref 0.0–1.2)
CO2: 23 mmol/L (ref 20–29)
Calcium: 10 mg/dL (ref 8.7–10.2)
Chloride: 105 mmol/L (ref 96–106)
Creatinine, Ser: 1.68 mg/dL — ABNORMAL HIGH (ref 0.76–1.27)
GFR calc non Af Amer: 47 mL/min/{1.73_m2} — ABNORMAL LOW (ref >59)
GFR, EST AFRICAN AMERICAN: 55 mL/min/{1.73_m2} — AB (ref >59)
Globulin, Total: 2.7 g/dL (ref 1.5–4.5)
Glucose: 80 mg/dL (ref 65–99)
Potassium: 4.4 mmol/L (ref 3.5–5.2)
Sodium: 139 mmol/L (ref 134–144)
TOTAL PROTEIN: 7.2 g/dL (ref 6.0–8.5)

## 2017-09-13 MED ORDER — PRAVASTATIN SODIUM 40 MG PO TABS: 40.0000 mg | ORAL_TABLET | Freq: Every day | ORAL | 3 refills | Status: DC

## 2017-09-15 ENCOUNTER — Telehealth: Payer: Self-pay

## 2017-09-15 DIAGNOSIS — R944 Abnormal results of kidney function studies: Secondary | ICD-10-CM

## 2017-09-15 NOTE — Telephone Encounter (Signed)
Order placed for 2 months.

## 2017-09-15 NOTE — Telephone Encounter (Signed)
-----   Message from Hoy RegisterEnobong Newlin, MD sent at 09/13/2017  4:56 PM EDT ----- Cholesterol is still elevated; I have increased pravastatin from 20 mg to 40 mg.  Please encourage to adhere to a low-cholesterol diet.  Kidney function is slightly abnormal but stable compared to previous labs.  Please advise against frequent NSAID use

## 2017-09-15 NOTE — Telephone Encounter (Signed)
Patient was called and informed of lab results. Patient states that he would like to get his kidney function checked more often then every six months. Patient would like to get kidney function checked in 60 days from now.

## 2017-09-16 NOTE — Telephone Encounter (Signed)
Patient has been informed of Kidney function recheck in 2 months.

## 2017-11-11 ENCOUNTER — Other Ambulatory Visit: Payer: Managed Care, Other (non HMO)

## 2017-11-25 ENCOUNTER — Other Ambulatory Visit: Payer: Self-pay

## 2017-12-28 ENCOUNTER — Telehealth: Payer: Self-pay

## 2017-12-28 DIAGNOSIS — E782 Mixed hyperlipidemia: Secondary | ICD-10-CM

## 2017-12-28 MED ORDER — PRAVASTATIN SODIUM 40 MG PO TABS: 40.0000 mg | ORAL_TABLET | Freq: Every day | ORAL | 2 refills | Status: DC

## 2017-12-28 NOTE — Telephone Encounter (Signed)
rx sent

## 2018-10-04 ENCOUNTER — Encounter: Payer: Self-pay | Admitting: Family Medicine

## 2018-10-04 ENCOUNTER — Other Ambulatory Visit: Payer: Self-pay

## 2018-10-04 ENCOUNTER — Ambulatory Visit: Payer: Managed Care, Other (non HMO) | Attending: Family Medicine | Admitting: Family Medicine

## 2018-10-04 VITALS — BP 145/94 | HR 75 | Temp 98.2°F | Ht 68.0 in | Wt 180.0 lb

## 2018-10-04 DIAGNOSIS — Z125 Encounter for screening for malignant neoplasm of prostate: Secondary | ICD-10-CM | POA: Diagnosis not present

## 2018-10-04 DIAGNOSIS — Z720 Tobacco use: Secondary | ICD-10-CM

## 2018-10-04 DIAGNOSIS — I10 Essential (primary) hypertension: Secondary | ICD-10-CM | POA: Diagnosis not present

## 2018-10-04 DIAGNOSIS — M5136 Other intervertebral disc degeneration, lumbar region: Secondary | ICD-10-CM

## 2018-10-04 DIAGNOSIS — E782 Mixed hyperlipidemia: Secondary | ICD-10-CM

## 2018-10-04 MED ORDER — PRAVASTATIN SODIUM 40 MG PO TABS: 40.0000 mg | ORAL_TABLET | Freq: Every day | ORAL | 6 refills | Status: DC

## 2018-10-04 MED ORDER — AMLODIPINE BESYLATE 5 MG PO TABS: 5.0000 mg | ORAL_TABLET | Freq: Every day | ORAL | 6 refills | Status: DC

## 2018-10-04 MED ORDER — NICOTINE 14 MG/24HR TD PT24
14.0000 mg | MEDICATED_PATCH | Freq: Every day | TRANSDERMAL | 1 refills | Status: DC
Start: 2018-10-04 — End: 2021-02-09

## 2018-10-04 NOTE — Progress Notes (Signed)
Subjective:  [name], male    DOB: 03-May-1970  Age: 48 y.o. MRN: [id number]  CC: Hypertension   HPI Ethaniel Garfield is a 48 year old male with a history of tobacco abuse, hypertension, hyperlipidemia, history of R nephrectomy for right kidney mass, chronic low back pain due to degenerative disc disease, herniated lumbar disc who presents today for a follow-up visit. He has run out of his antihypertensive hence his elevated blood pressure and has also been out of pravastatin which he takes for hyperlipidemia.  His last set of labs from 08/2017 revealed elevated lipid panel. He has chronic low back pain from degenerative disc disease and lumbar disc herniation is stable with no flares and he is currently not using any pain medications; he also has a high pain threshold. He denies chest pains or dyspnea. He would like to be screened for prostate cancer; denies family history of prostate cancer.  Past Medical History:  Diagnosis Date  . Chronic kidney disease    kidney issue  . Hypertension     Past Surgical History:  Procedure Laterality Date  . LAPAROSCOPIC NEPHRECTOMY Right 09/29/2016   Procedure: LAPAROSCOPIC RADICAL  NEPHRECTOMY RIGHT;  Surgeon: Ardis Hughs, MD;  Location: [facility];  Service: Urology;  Laterality: Right;  . MASTECTOMY    . MASTECTOMY  1989    Family History  Problem Relation Age of Onset  . Hypertension Mother   . COPD Mother   . Heart disease Father     No Known Allergies  Outpatient Medications Prior to Visit  Medication Sig Dispense Refill  . amLODipine (NORVASC) 5 MG tablet Take 1 tablet (5 mg total) by mouth daily. 30 tablet 6  . pravastatin (PRAVACHOL) 40 MG tablet Take 1 tablet (40 mg total) by mouth daily. 30 tablet 2  . acetaminophen (TYLENOL) 500 MG tablet Take 1 tablet (500 mg total) by mouth every 6 (six) hours as needed. (Patient not taking: Reported on 06/03/2017) 100 tablet 2  . methocarbamol (ROBAXIN) 500 MG tablet Take 1  tablet (500 mg total) by mouth 3 (three) times daily. X 10 days then prn muscle spasm (Patient not taking: Reported on 10/04/2018) 90 tablet 0  . ranitidine (ZANTAC) 75 MG tablet Take 75 mg by mouth daily as needed for heartburn.    . traMADol (ULTRAM) 50 MG tablet Take 1 tablet (50 mg total) by mouth every 6 (six) hours as needed. (Patient not taking: Reported on 10/04/2018) 40 tablet 0  . traMADol (ULTRAM) 50 MG tablet Take 1-2 tablets (50-100 mg total) by mouth every 6 (six) hours as needed for moderate pain. (Patient not taking: Reported on 06/03/2017) 20 tablet 0   No facility-administered medications prior to visit.      ROS Review of Systems  Constitutional: Negative for activity change and appetite change.  HENT: Negative for sinus pressure and sore throat.   Eyes: Negative for visual disturbance.  Respiratory: Negative for cough, chest tightness and shortness of breath.   Cardiovascular: Negative for chest pain and leg swelling.  Gastrointestinal: Negative for abdominal distention, abdominal pain, constipation and diarrhea.  Endocrine: Negative.   Genitourinary: Negative for dysuria.  Musculoskeletal: Negative for joint swelling and myalgias.  Skin: Negative for rash.  Allergic/Immunologic: Negative.   Neurological: Negative for weakness, light-headedness and numbness.  Psychiatric/Behavioral: Negative for dysphoric mood and suicidal ideas.    Objective:  BP (!) 145/94   Pulse 75   Temp 98.2 F (36.8 C) (Oral)   Ht 5'  8" (1.727 m)   Wt 180 lb (81.6 kg)   SpO2 99%   BMI 27.37 kg/m   BP/Weight 10/04/2018 09/12/2017 7/51/7001  Systolic BP 749 449 675  Diastolic BP 94 85 94  Wt. (Lbs) 180 178 180.4  BMI 27.37 27.88 28.25      Physical Exam Constitutional:      Appearance: He is well-developed.  Cardiovascular:     Rate and Rhythm: Normal rate.     Heart sounds: Normal heart sounds. No murmur.  Pulmonary:     Effort: Pulmonary effort is normal.     Breath sounds:  Normal breath sounds. No wheezing or rales.  Chest:     Chest wall: No tenderness.  Abdominal:     General: Bowel sounds are normal. There is no distension.     Palpations: Abdomen is soft. There is no mass.     Tenderness: There is no abdominal tenderness.  Musculoskeletal: Normal range of motion.        General: No tenderness.  Neurological:     Mental Status: He is alert and oriented to person, place, and time.     Sensory: No sensory deficit.     Motor: No weakness.     Gait: Gait normal.     Deep Tendon Reflexes: Reflexes normal.     CMP Latest Ref Rng & Units 09/12/2017 01/31/2017 09/30/2016  Glucose 65 - 99 mg/dL 80 101(H) 95  BUN 6 - 24 mg/dL '17 20 11  ' Creatinine 0.76 - 1.27 mg/dL 1.68(H) 1.61(H) 1.71(H)  Sodium 134 - 144 mmol/L 139 142 139  Potassium 3.5 - 5.2 mmol/L 4.4 4.3 3.8  Chloride 96 - 106 mmol/L 105 102 106  CO2 20 - 29 mmol/L '23 27 28  ' Calcium 8.7 - 10.2 mg/dL 10.0 10.0 8.7(L)  Total Protein 6.0 - 8.5 g/dL 7.2 7.2 -  Total Bilirubin 0.0 - 1.2 mg/dL 0.4 0.3 -  Alkaline Phos 39 - 117 IU/L 59 53 -  AST 0 - 40 IU/L 22 24 -  ALT 0 - 44 IU/L 24 30 -    Lipid Panel     Component Value Date/Time   CHOL 253 (H) 09/12/2017 1203   TRIG 216 (H) 09/12/2017 1203   HDL 42 09/12/2017 1203   CHOLHDL 6.0 (H) 09/12/2017 1203   LDLCALC 168 (H) 09/12/2017 1203    CBC    Component Value Date/Time   WBC 7.5 09/23/2016 1116   RBC 4.51 09/23/2016 1116   HGB 12.8 (L) 09/30/2016 0405   HGB 13.9 07/08/2016 1104   HCT 38.8 (L) 09/30/2016 0405   HCT 41.5 07/08/2016 1104   PLT 307 09/23/2016 1116   PLT 263 07/08/2016 1104   MCV 91.6 09/23/2016 1116   MCV 91 07/08/2016 1104   MCH 30.2 09/23/2016 1116   MCHC 32.9 09/23/2016 1116   RDW 13.2 09/23/2016 1116   RDW 13.8 07/08/2016 1104   LYMPHSABS 2.7 07/08/2016 1104   EOSABS 0.1 07/08/2016 1104   BASOSABS 0.0 07/08/2016 1104    Lab Results  Component Value Date   HGBA1C 5.3 07/08/2016    Assessment & Plan:   1.  Essential hypertension Uncontrolled due to running out of medications which have refilled Counseled on blood pressure goal of less than 130/80, low-sodium, DASH diet, medication compliance, 150 minutes of moderate intensity exercise per week. Discussed medication compliance, adverse effects. - amLODipine (NORVASC) 5 MG tablet; Take 1 tablet (5 mg total) by mouth daily.  Dispense: 30 tablet; Refill:  6 - CMP14+EGFR  2. Mixed hyperlipidemia Uncontrolled due to running out of statin which has been refilled today Low-cholesterol diet Lipid panel at next visit - pravastatin (PRAVACHOL) 40 MG tablet; Take 1 tablet (40 mg total) by mouth daily.  Dispense: 30 tablet; Refill: 6  3. Tobacco abuse Smoking cessation support: smoking cessation hotline: 1-800-QUIT-NOW.  Smoking cessation classes are available through [facility]. Call [phone] or visit our website at [email or web address]  Spent 4 minutes counseling on dangers of tobacco use and benefits of quitting and patient is ready to quit.  - nicotine (NICODERM CQ) 14 mg/24hr patch; Place 1 patch (14 mg total) onto the skin daily.  Dispense: 28 patch; Refill: 1  4. Screening for prostate cancer Discussed risks and benefits of prostate cancer screening and the fact the recommendation is from age 27 to 60 years however he decides to proceed with it - PSA, total and free  5. DDD (degenerative disc disease), lumbar Stable with no exacerbation He is doing well off medications   Health Care Maintenance: Tdap at next visit Meds ordered this encounter  Medications  . amLODipine (NORVASC) 5 MG tablet    Sig: Take 1 tablet (5 mg total) by mouth daily.    Dispense:  30 tablet    Refill:  6  . pravastatin (PRAVACHOL) 40 MG tablet    Sig: Take 1 tablet (40 mg total) by mouth daily.    Dispense:  30 tablet    Refill:  6    Discontinue previous dose  . nicotine (NICODERM CQ) 14 mg/24hr patch    Sig: Place 1 patch (14 mg  total) onto the skin daily.    Dispense:  28 patch    Refill:  1    Follow-up: Return in about 6 months (around 04/03/2019) for Medical conditions.       Charlott Rakes, MD, FAAFP. [facility] [place], [place]   10/04/2018, 5:57 PM

## 2018-10-05 LAB — CMP14+EGFR
ALT: 31 IU/L (ref 0–44)
AST: 26 IU/L (ref 0–40)
Albumin/Globulin Ratio: 1.8 (ref 1.2–2.2)
Albumin: 4.3 g/dL (ref 4.0–5.0)
Alkaline Phosphatase: 60 IU/L (ref 39–117)
BUN/Creatinine Ratio: 10 (ref 9–20)
BUN: 17 mg/dL (ref 6–24)
Bilirubin Total: <0.2 mg/dL (ref 0.0–1.2)
CO2: 23 mmol/L (ref 20–29)
Calcium: 9.3 mg/dL (ref 8.7–10.2)
Chloride: 102 mmol/L (ref 96–106)
Creatinine, Ser: 1.77 mg/dL — ABNORMAL HIGH (ref 0.76–1.27)
GFR calc Af Amer: 51 mL/min/{1.73_m2} — ABNORMAL LOW (ref >59)
GFR calc non Af Amer: 44 mL/min/{1.73_m2} — ABNORMAL LOW (ref >59)
Globulin, Total: 2.4 g/dL (ref 1.5–4.5)
Glucose: 86 mg/dL (ref 65–99)
Potassium: 4.4 mmol/L (ref 3.5–5.2)
Sodium: 143 mmol/L (ref 134–144)
Total Protein: 6.7 g/dL (ref 6.0–8.5)

## 2018-10-05 LAB — PSA, TOTAL AND FREE
PSA, Free Pct: 44.4 %
PSA, Free: 0.4 ng/mL
Prostate Specific Ag, Serum: 0.9 ng/mL (ref 0.0–4.0)

## 2019-01-11 IMAGING — MR MR LUMBAR SPINE W/O CM
4 of 5 series · 18 of 48 positions shown · non-contrast
Comparison: Prior radiograph from 06/03/2016.

CLINICAL DATA: Initial evaluation for diffuse low back and
bilateral leg pain status posts fall 2 months ago. Right leg
numbness in weakness.

EXAM:
MRI LUMBAR SPINE WITHOUT CONTRAST
TECHNIQUE: Multiplanar, multisequence MR imaging of the lumbar spine was
performed. No intravenous contrast was administered.

[Series 6: T2 · sagittal · 4.0mm · 0.73mm/px · 8 of 19 slices shown (1 of 2)]
[im 1/19]
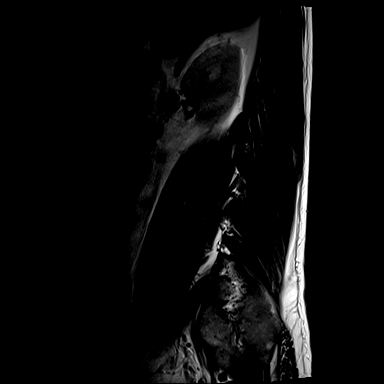
[im 3/19]
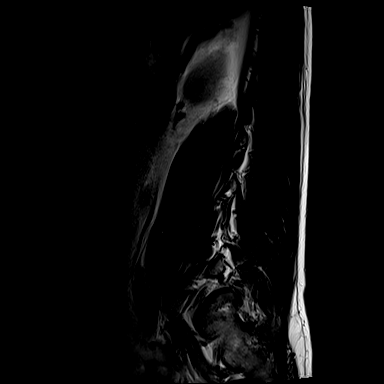
[im 6/19]
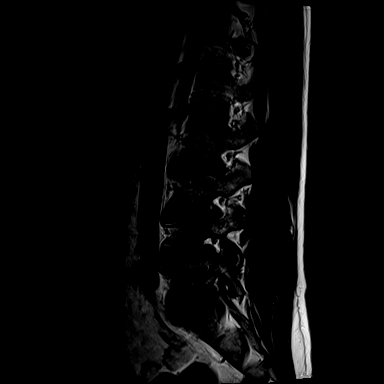
[im 8/19]
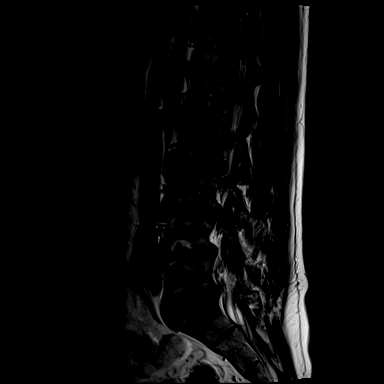
[im 11/19]
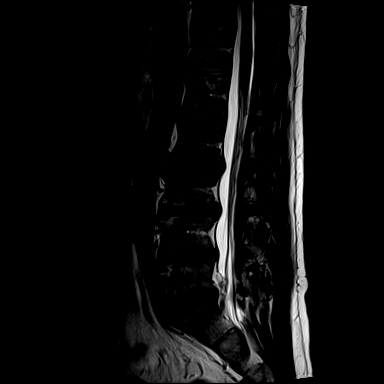
[im 13/19]
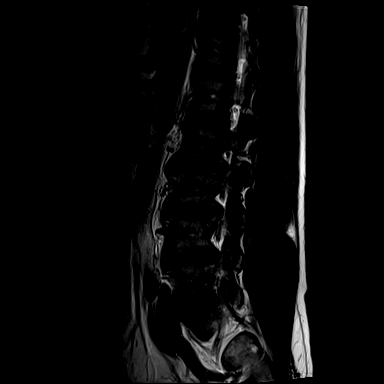
[im 16/19]
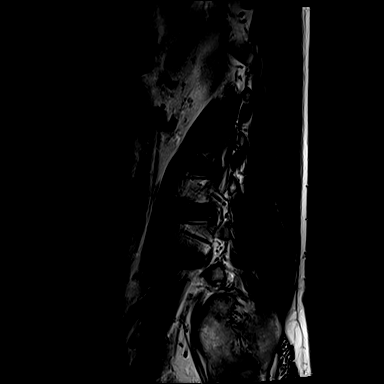
[im 19/19]
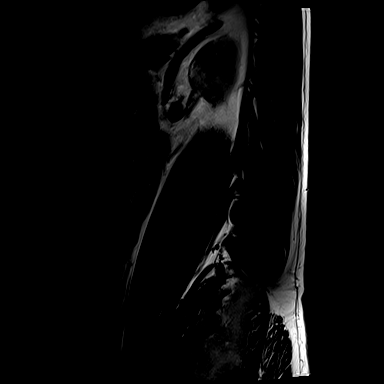

[Series 7: T1 · sagittal · 4.0mm · 0.73mm/px · 3 of 19 slices shown (1 of 2)]
[im 4/19]
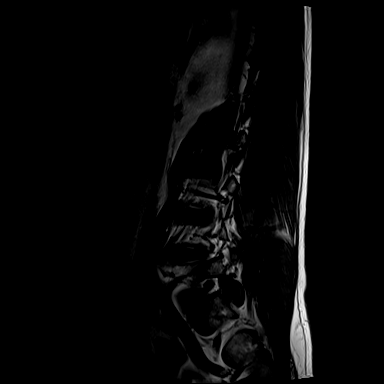
[im 10/19]
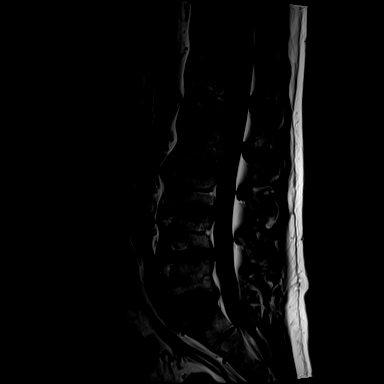
[im 16/19]
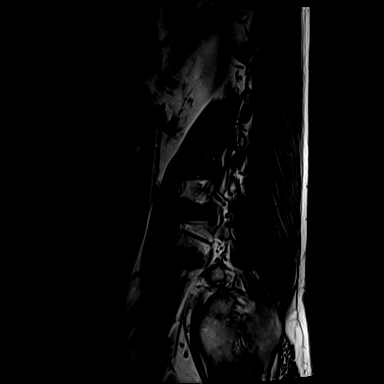

[Series 11: T1 · axial · 4.0mm · 0.28mm/px · z∈[-54,+62]mm · 3 of 35 slices shown (2 of 2)]
[im 6/35]
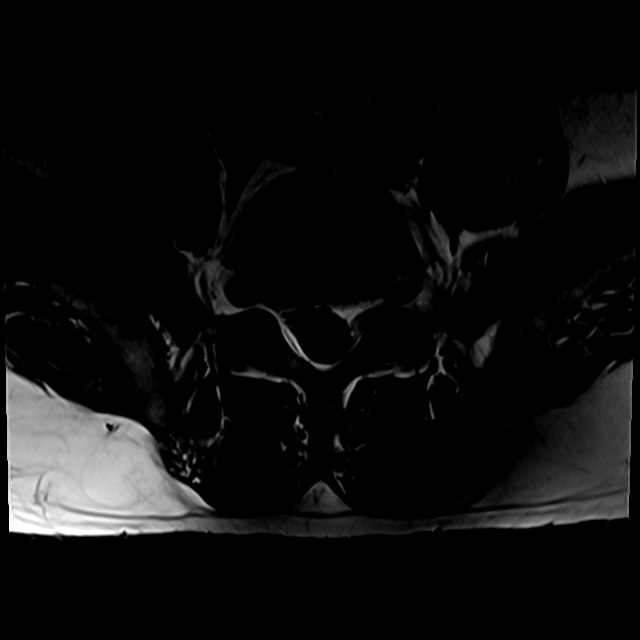
[im 18/35]
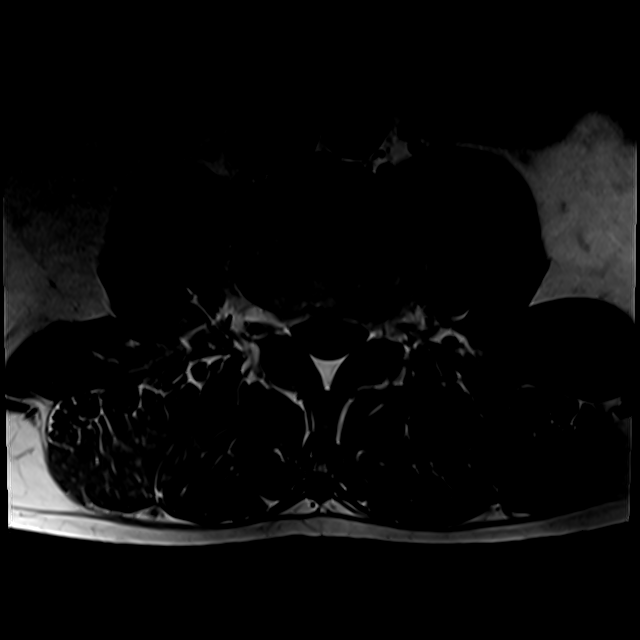
[im 29/35]
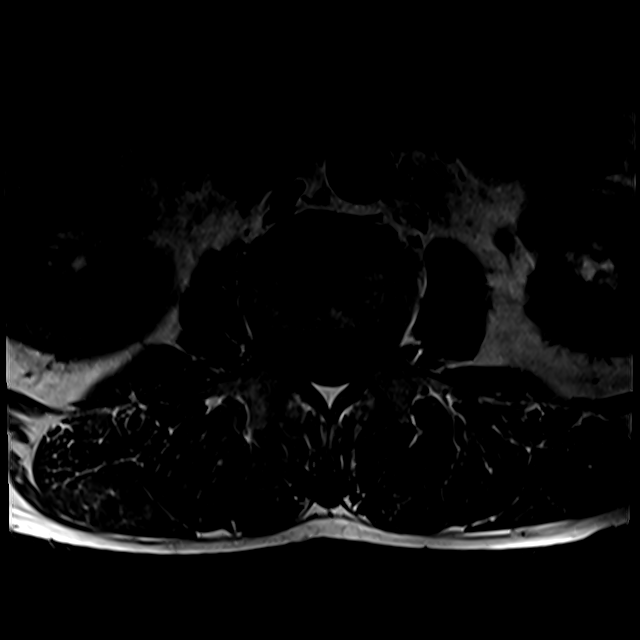

[Series 14: T2 · axial · 4.0mm · 0.28mm/px · z∈[-79,+62]mm · 4 of 35 slices shown (2 of 2)]
[im 1/35]
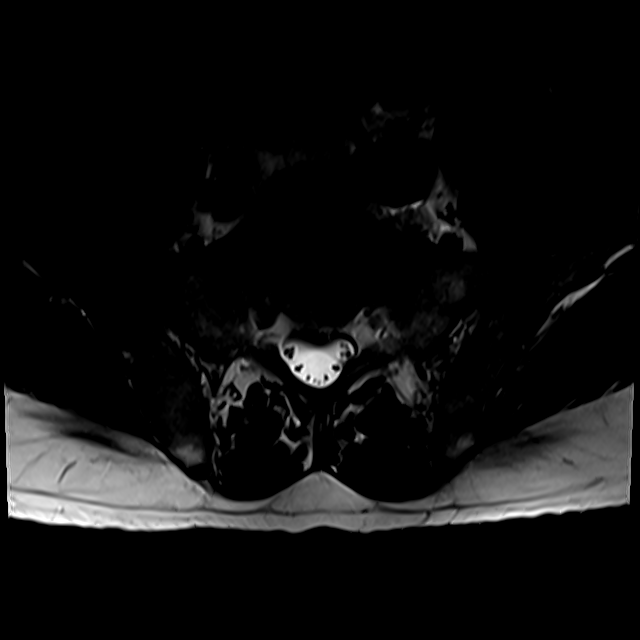
[im 6/35]
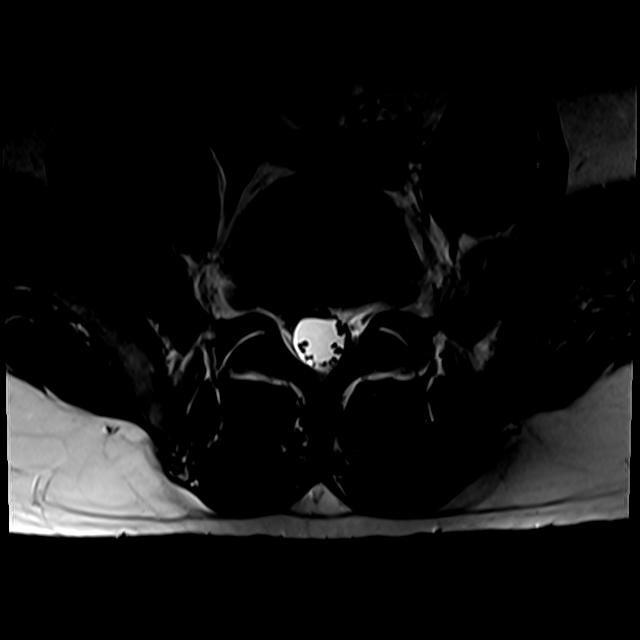
[im 18/35]
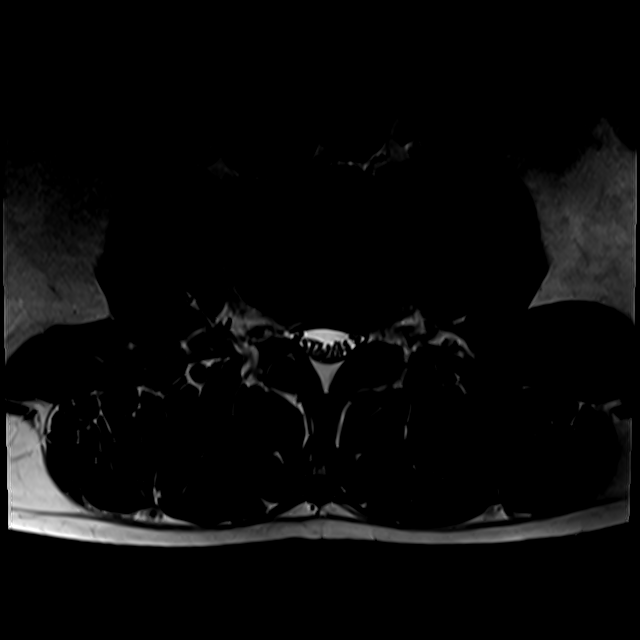
[im 29/35]
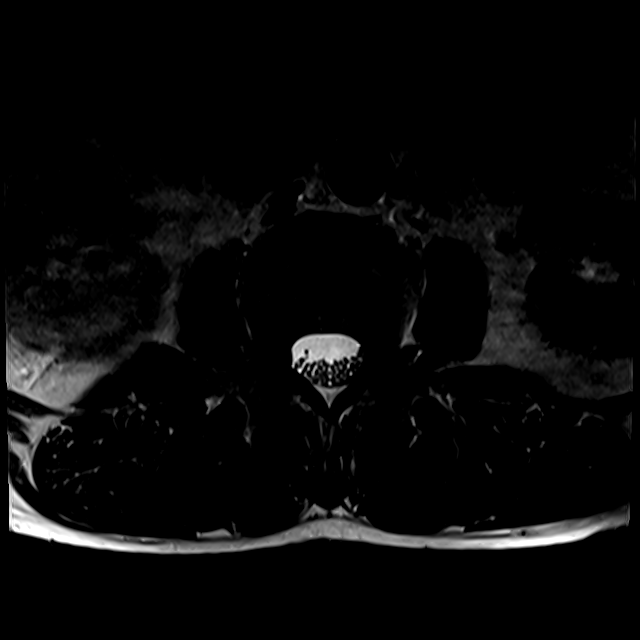

[18 of 48 positions shown; findings below may reference images not displayed]

FINDINGS: Segmentation: Normal segmentation. Lowest well-formed disc is
labeled the L5-S1 level.

Alignment: Mild straightening of the normal lumbar lordosis. No
listhesis.

Vertebrae: Vertebral body heights are maintained. No evidence for
acute, subacute, or chronic fracture. Mild reactive endplate changes
about the L5-S1 interspace. Degenerative endplate Schmorl's nodes
present at the superior endplates of L1 and L3 as well as the
inferior endplate of L4. Signal intensity within the vertebral body
bone marrow within normal limits. No discrete or worrisome osseous
lesion.

Conus medullaris: Extends to the L1 level and appears normal.

Paraspinal and other soft tissues: Paraspinous soft tissues
demonstrate no acute abnormality. There is a mildly complex
predominantly T2 hyperintense cystic lesion measuring 3.3 cm within
the interpolar right kidney, partially visualized (series 14, image
1). Remainder the visualized visceral structures otherwise
unremarkable.

Disc levels:

L1-2:  Unremarkable.

L2-3: Diffuse degenerative disc bulge with disc desiccation and
intervertebral disc space narrowing. Disc bulging slightly eccentric
to the left without focal disc protrusion. Mild flattening and
indentation of the ventral thecal sac with resultant mild lateral
recess narrowing without significant canal stenosis. Mild left L2
foraminal narrowing.

L3-4: Diffuse degenerative disc bulge with intervertebral disc space
narrowing and disc desiccation. There is a superimposed shallow
broad left foraminal/ extraforaminal disc protrusion (series 14,
image 19). Protruding disc closely approximates the exiting left L3
nerve root without frank neural impingement. Flattening of the left
ventral thecal sac with minimal left lateral recess narrowing. No
significant canal stenosis. Foramina remain widely patent.

L4-5: Diffuse degenerative disc bulge with intervertebral disc space
narrowing and disc desiccation. Disc bulging slightly eccentric to
the left. Mild flattening of the left ventral thecal sac with
minimal left lateral recess narrowing. No significant canal or right
foraminal narrowing.

L5-S1: Diffuse degenerative disc bulge with disc desiccation. Mild
reactive endplate changes. No significant canal or lateral recess
stenosis. Mild to moderate bilateral L5 foraminal narrowing, right
worse than left.
IMPRESSION: 1. Degenerative disc bulge with mild reactive endplate changes at
L5-S1 with resultant mild to moderate bilateral L5 foraminal
stenosis, right worse than left.
2. Broad left foraminal/extraforaminal disc protrusion at L3-4
without significant stenosis or associated neural impingement.
3. Additional degenerative disc bulging at L2-3 and L4-5 as above
without significant stenosis or neural impingement.
4. 3.3 cm mildly complex cystic right renal lesion, indeterminate.
Further evaluation with dedicated renal mass protocol MRI and/or CT
recommended for complete characterization.
These results will be called to the ordering clinician or
representative by the Radiologist Assistant, and communication
documented in the PACS or zVision Dashboard.

## 2019-07-11 ENCOUNTER — Other Ambulatory Visit: Payer: Self-pay | Admitting: Pharmacist

## 2019-07-11 DIAGNOSIS — I10 Essential (primary) hypertension: Secondary | ICD-10-CM

## 2019-07-11 DIAGNOSIS — E782 Mixed hyperlipidemia: Secondary | ICD-10-CM

## 2019-07-11 MED ORDER — PRAVASTATIN SODIUM 40 MG PO TABS: 40.0000 mg | ORAL_TABLET | Freq: Every day | ORAL | 0 refills | Status: DC

## 2019-07-11 MED ORDER — AMLODIPINE BESYLATE 5 MG PO TABS: 5.0000 mg | ORAL_TABLET | Freq: Every day | ORAL | 0 refills | Status: DC

## 2019-07-11 MED FILL — AMLODIPINE BESYLATE 5 MG TA: 5 | 30 days supply | Qty: 30 | Fill #0

## 2019-07-11 MED FILL — PRAVASTATIN NA 40 MG TAB: 40 | 30 days supply | Qty: 30 | Fill #0

## 2019-07-25 ENCOUNTER — Ambulatory Visit: Payer: Self-pay | Attending: Physician Assistant | Admitting: Physician Assistant

## 2019-07-25 ENCOUNTER — Other Ambulatory Visit: Payer: Self-pay

## 2019-07-25 ENCOUNTER — Other Ambulatory Visit: Payer: Self-pay | Admitting: Physician Assistant

## 2019-07-25 ENCOUNTER — Encounter: Payer: Self-pay | Admitting: Physician Assistant

## 2019-07-25 VITALS — BP 155/105 | HR 75 | Temp 98.1°F | Resp 16 | Wt 180.8 lb

## 2019-07-25 DIAGNOSIS — E782 Mixed hyperlipidemia: Secondary | ICD-10-CM

## 2019-07-25 DIAGNOSIS — I10 Essential (primary) hypertension: Secondary | ICD-10-CM

## 2019-07-25 MED ORDER — AMLODIPINE BESYLATE 10 MG PO TABS: 10.0000 mg | ORAL_TABLET | Freq: Every day | ORAL | 1 refills | Status: DC

## 2019-07-25 MED ORDER — PRAVASTATIN SODIUM 40 MG PO TABS: 40.0000 mg | ORAL_TABLET | Freq: Every day | ORAL | 1 refills | Status: DC

## 2019-07-25 MED FILL — AMLODIPINE BESYLATE 10 MG T: 10 | 30 days supply | Qty: 30 | Fill #0

## 2019-07-25 NOTE — Patient Instructions (Signed)
Check blood pressures out side of the office and record.  Goal blood pressure =120/80.  If BP is consistently hire than this after being on new meds for more than 1 month, call for an appt

## 2019-07-25 NOTE — Progress Notes (Signed)
Dave Jimenez, is a 49 y.o. male  QQP:[id number]  ZTI:[id number]  DOB - 1970/10/12  Subjective:  Chief Complaint and HPI: Dave Jimenez is a 49 y.o. male here today to  For BP and labs.  He has been off meds since february until about 2 weeks ago.  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  No HA/CP/dizziness.  He has already eaten today.     ROS:   Constitutional:  No f/c, No night sweats, No unexplained weight loss. EENT:  No vision changes, No blurry vision, No hearing changes. No mouth, throat, or ear problems.  Respiratory: No cough, No SOB Cardiac: No CP, no palpitations GI:  No abd pain, No N/V/D. GU: No Urinary s/sx Musculoskeletal: No joint pain Neuro: No headache, no dizziness, no motor weakness.  Skin: No rash Endocrine:  No polydipsia. No polyuria.  Psych: Denies SI/HI  No problems updated.  ALLERGIES: No Known Allergies  PAST MEDICAL HISTORY: Past Medical History:  Diagnosis Date   Chronic kidney disease    kidney issue   Hypertension     MEDICATIONS AT HOME: Prior to Admission medications   Medication Sig Start Date End Date Taking? Authorizing Provider  acetaminophen (TYLENOL) 500 MG tablet Take 1 tablet (500 mg total) by mouth every 6 (six) hours as needed. Patient not taking: Reported on 06/03/2017 08/05/16   Kerrin Champagne, MD  amLODipine (NORVASC) 10 MG tablet Take 1 tablet (10 mg total) by mouth daily. 07/25/19   Anders Simmonds, PA-C  methocarbamol (ROBAXIN) 500 MG tablet Take 1 tablet (500 mg total) by mouth 3 (three) times daily. X 10 days then prn muscle spasm Patient not taking: Reported on 10/04/2018 05/27/17   Kerrin Champagne, MD  nicotine (NICODERM CQ) 14 mg/24hr patch Place 1 patch (14 mg total) onto the skin daily. 10/04/18   Hoy Register, MD  pravastatin (PRAVACHOL) 40 MG tablet Take 1 tablet (40 mg total) by mouth daily. 07/25/19   Anders Simmonds, PA-C  ranitidine (ZANTAC) 75 MG tablet Take 75 mg by mouth daily as needed for  heartburn.    [provider]  traMADol (ULTRAM) 50 MG tablet Take 1 tablet (50 mg total) by mouth every 6 (six) hours as needed. Patient not taking: Reported on 10/04/2018 02/02/17   Kerrin Champagne, MD  traMADol (ULTRAM) 50 MG tablet Take 1-2 tablets (50-100 mg total) by mouth every 6 (six) hours as needed for moderate pain. Patient not taking: Reported on 06/03/2017 05/27/17   Kerrin Champagne, MD     Objective:  EXAM:   Vitals:   07/25/19 1052  BP: (!) 155/105  Pulse: 75  Resp: 16  Temp: 98.1 F (36.7 C)  SpO2: 97%  Weight: 180 lb 12.8 oz (82 kg)    General appearance : A&OX3. NAD. Non-toxic-appearing HEENT: Atraumatic and Normocephalic.  PERRLA. EOM intact.  TM clear B. Mouth-MMM, post pharynx WNL w/o erythema, No PND. Neck: supple, no JVD. No cervical lymphadenopathy. No thyromegaly Chest/Lungs:  Breathing-non-labored, Good air entry bilaterally, breath sounds normal without rales, rhonchi, or wheezing  CVS: S1 S2 regular, no murmurs, gallops, rubs  Abdomen: Bowel sounds present, Non tender and not distended with no gaurding, rigidity or rebound. Extremities: Bilateral Lower Ext shows no edema, both legs are warm to touch with = pulse throughout Neurology:  CN II-XII grossly intact, Non focal.   Psych:  TP linear. J/I WNL. Normal speech. Appropriate eye contact and affect.  Skin:  No Rash  Data Review Lab Results  Component Value Date   HGBA1C 5.3 07/08/2016     Assessment & Plan   1. Mixed hyperlipidemia Only back on meds X 2 weeks and not fasting.  Check labs in about 6 weeks fasting - pravastatin (PRAVACHOL) 40 MG tablet; Take 1 tablet (40 mg total) by mouth daily.  Dispense: 90 tablet; Refill: 1 - Lipid Panel; Future  2. Essential hypertension Not controlled.  Increase dose(see AVS) - Comprehensive metabolic panel; Future - amLODipine (NORVASC) 10 MG tablet; Take 1 tablet (10 mg total) by mouth daily.  Dispense: 90 tablet; Refill: 1   Patient have  been counseled extensively about nutrition and exercise  Return in about 3 months (around 10/25/2019) for PCP;  chronic conditions.  The patient was given clear instructions to go to ER or return to medical center if symptoms don't improve, worsen or new problems develop. The patient verbalized understanding. The patient was told to call to get lab results if they haven't heard anything in the next week.     Georgian Co, PA-C [facility] [place], [place] [phone]   07/25/2019, 11:13 AMPatient ID: Dave Jimenez, male   DOB: 1970/03/25, 49 y.o.   MRN: [id number]

## 2019-09-05 ENCOUNTER — Other Ambulatory Visit: Payer: Self-pay

## 2019-09-06 ENCOUNTER — Other Ambulatory Visit: Payer: Self-pay

## 2019-09-06 ENCOUNTER — Ambulatory Visit: Payer: Self-pay | Attending: Family Medicine

## 2019-09-06 DIAGNOSIS — E782 Mixed hyperlipidemia: Secondary | ICD-10-CM

## 2019-09-06 DIAGNOSIS — I10 Essential (primary) hypertension: Secondary | ICD-10-CM

## 2019-09-07 LAB — COMPREHENSIVE METABOLIC PANEL
ALT: 28 IU/L (ref 0–44)
AST: 24 IU/L (ref 0–40)
Albumin/Globulin Ratio: 2.2 (ref 1.2–2.2)
Albumin: 4.6 g/dL (ref 4.0–5.0)
Alkaline Phosphatase: 60 IU/L (ref 48–121)
BUN/Creatinine Ratio: 10 (ref 9–20)
BUN: 15 mg/dL (ref 6–24)
Bilirubin Total: 0.4 mg/dL (ref 0.0–1.2)
CO2: 24 mmol/L (ref 20–29)
Calcium: 9.6 mg/dL (ref 8.7–10.2)
Chloride: 108 mmol/L — ABNORMAL HIGH (ref 96–106)
Creatinine, Ser: 1.53 mg/dL — ABNORMAL HIGH (ref 0.76–1.27)
GFR calc Af Amer: 60 mL/min/{1.73_m2} (ref >59)
GFR calc non Af Amer: 52 mL/min/{1.73_m2} — ABNORMAL LOW (ref >59)
Globulin, Total: 2.1 g/dL (ref 1.5–4.5)
Glucose: 86 mg/dL (ref 65–99)
Potassium: 4.3 mmol/L (ref 3.5–5.2)
Sodium: 144 mmol/L (ref 134–144)
Total Protein: 6.7 g/dL (ref 6.0–8.5)

## 2019-09-07 LAB — LIPID PANEL
Chol/HDL Ratio: 5.3 ratio — ABNORMAL HIGH (ref 0.0–5.0)
Cholesterol, Total: 246 mg/dL — ABNORMAL HIGH (ref 100–199)
HDL: 46 mg/dL (ref >39)
LDL Chol Calc (NIH): 176 mg/dL — ABNORMAL HIGH (ref 0–99)
Triglycerides: 131 mg/dL (ref 0–149)
VLDL Cholesterol Cal: 24 mg/dL (ref 5–40)

## 2019-09-20 MED FILL — AMLODIPINE BESYLATE 10 MG T: 10 | 30 days supply | Qty: 30 | Fill #1

## 2019-09-25 MED FILL — PRAVASTATIN NA 40 MG TAB: 40 | 30 days supply | Qty: 30 | Fill #0

## 2019-10-25 ENCOUNTER — Ambulatory Visit: Payer: Self-pay | Admitting: Family Medicine

## 2020-03-12 MED FILL — AMLODIPINE BESYLATE 10 MG T: 10 | 30 days supply | Qty: 30 | Fill #2

## 2020-12-16 ENCOUNTER — Ambulatory Visit: Payer: Self-pay | Admitting: *Deleted

## 2020-12-16 ENCOUNTER — Other Ambulatory Visit: Payer: Self-pay

## 2020-12-16 NOTE — Telephone Encounter (Signed)
Reason for Disposition  [1] Systolic BP  >= 200 OR Diastolic >= 120 AND [2] having NO cardiac or neurologic symptoms  Answer Assessment - Initial Assessment Questions 1. BLOOD PRESSURE: "What is the blood pressure?" "Did you take at least two measurements 5 minutes apart?"     Pt calling in BP 168/128 yesterday and 166/111.   159/120 too.    This am 159/116 first thing this morning.    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   I was put on HCTZ with one kidney that is bad with only 1 kidney.    I was without insurance so I was off my BP medication.    Recently I've been under a lot of stress.     2. ONSET: "When did you take your blood pressure?"     Yesterday and this morning.    3. HOW: "How did you obtain the blood pressure?" (e.g., visiting nurse, automatic home BP monitor)     I have BP cuff. 4. HISTORY: "Do you have a history of high blood pressure?"     Yes 5. MEDICATIONS: "Are you taking any medications for blood pressure?" "Have you missed any doses recently?"     Not on BP pill because I've been out.   Since June or July. 6. OTHER SYMPTOMS: "Do you have any symptoms?" (e.g., headache, chest pain, blurred vision, difficulty breathing, weakness)     Headache, I'm not seeing stars when I stand up or feeling dizzy,   I've had blurry vision but I've been watching TV in the dark.  I've not had my glasses on though.  7. PREGNANCY: "Is there any chance you are pregnant?" "When was your last menstrual period?"     N/A  Protocols used: Blood Pressure - High-A-AH

## 2020-12-16 NOTE — Telephone Encounter (Addendum)
Pt calling in concerned his BP is elevated.  168/128 yesterday and has a headache.  159/118.  This morning 150/102 first BP laying down with right arm.   They I switched arms  left arm it's 151/93 this morning.    163/128 last night.     I have referred him to the [facility].   He was agreeable to going.   "I'm getting dressed now to go on over there".

## 2020-12-29 ENCOUNTER — Other Ambulatory Visit: Payer: Self-pay

## 2020-12-29 ENCOUNTER — Encounter: Payer: Self-pay | Admitting: Family Medicine

## 2020-12-29 ENCOUNTER — Ambulatory Visit: Payer: Self-pay | Attending: Family Medicine | Admitting: Family Medicine

## 2020-12-29 VITALS — BP 186/120 | HR 69 | Wt 180.6 lb

## 2020-12-29 DIAGNOSIS — Z1159 Encounter for screening for other viral diseases: Secondary | ICD-10-CM

## 2020-12-29 DIAGNOSIS — Z1211 Encounter for screening for malignant neoplasm of colon: Secondary | ICD-10-CM

## 2020-12-29 DIAGNOSIS — I10 Essential (primary) hypertension: Secondary | ICD-10-CM

## 2020-12-29 DIAGNOSIS — E782 Mixed hyperlipidemia: Secondary | ICD-10-CM

## 2020-12-29 MED ORDER — PRAVASTATIN SODIUM 40 MG PO TABS
40.0000 mg | ORAL_TABLET | Freq: Every day | ORAL | 1 refills | Status: DC
  Filled 2020-12-29: qty 30, 30d supply, fill #0

## 2020-12-29 MED ORDER — AMLODIPINE BESYLATE 10 MG PO TABS
ORAL_TABLET | Freq: Every day | ORAL | 1 refills | Status: DC
  Filled 2020-12-29 – 2021-03-18 (×2): qty 30, 30d supply, fill #0
  Filled 2021-03-18: qty 30, 30d supply, fill #1

## 2020-12-29 NOTE — Patient Instructions (Signed)

## 2020-12-29 NOTE — Progress Notes (Signed)
Subjective:  [name], male    DOB: September 15, 1970  Age: 50 y.o. MRN: [id number]  CC: Hypertension   HPI Dave Jimenez is a 50 y.o. year old male with a history of tobacco abuse, hypertension, hyperlipidemia, history of R nephrectomy for right kidney mass, chronic low back pain due to degenerative disc disease, herniated lumbar disc who presents today for a follow-up visit. His visit to the clinic was in 07/2019.  Interval History: His BP is elevated and he has been without his medications as he thought he had lost his health insurance and was unaware the clinic would see him without health insurance.  He later got to find out he actually had bright health. He would like to get back on his statin and his antihypertensive. He exercises regularly by means of playing basketball with his son. He has no chest pain or dyspnea and has no additional concerns today.  Past Medical History:  Diagnosis Date   Chronic kidney disease    kidney issue   Hypertension     Past Surgical History:  Procedure Laterality Date   LAPAROSCOPIC NEPHRECTOMY Right 09/29/2016   Procedure: LAPAROSCOPIC RADICAL  NEPHRECTOMY RIGHT;  Surgeon: Ardis Hughs, MD;  Location: [facility];  Service: Urology;  Laterality: Right;   MASTECTOMY     MASTECTOMY  1989    Family History  Problem Relation Age of Onset   Hypertension Mother    COPD Mother    Heart disease Father     No Known Allergies  Outpatient Medications Prior to Visit  Medication Sig Dispense Refill   acetaminophen (TYLENOL) 500 MG tablet Take 1 tablet (500 mg total) by mouth every 6 (six) hours as needed. (Patient not taking: Reported on 06/03/2017) 100 tablet 2   methocarbamol (ROBAXIN) 500 MG tablet Take 1 tablet (500 mg total) by mouth 3 (three) times daily. X 10 days then prn muscle spasm (Patient not taking: Reported on 10/04/2018) 90 tablet 0   nicotine (NICODERM CQ) 14 mg/24hr patch Place 1 patch (14 mg total) onto the skin daily.  (Patient not taking: Reported on 12/29/2020) 28 patch 1   ranitidine (ZANTAC) 75 MG tablet Take 75 mg by mouth daily as needed for heartburn. (Patient not taking: Reported on 12/29/2020)     traMADol (ULTRAM) 50 MG tablet Take 1 tablet (50 mg total) by mouth every 6 (six) hours as needed. (Patient not taking: Reported on 10/04/2018) 40 tablet 0   traMADol (ULTRAM) 50 MG tablet Take 1-2 tablets (50-100 mg total) by mouth every 6 (six) hours as needed for moderate pain. (Patient not taking: Reported on 06/03/2017) 20 tablet 0   amLODipine (NORVASC) 10 MG tablet TAKE 1 TABLET (10 MG TOTAL) BY MOUTH DAILY. 90 tablet 1   pravastatin (PRAVACHOL) 40 MG tablet Take 1 tablet (40 mg total) by mouth daily. (Patient not taking: Reported on 12/29/2020) 90 tablet 1   No facility-administered medications prior to visit.     ROS Review of Systems  Constitutional:  Negative for activity change and appetite change.  HENT:  Negative for sinus pressure and sore throat.   Eyes:  Negative for visual disturbance.  Respiratory:  Negative for cough, chest tightness and shortness of breath.   Cardiovascular:  Negative for chest pain and leg swelling.  Gastrointestinal:  Negative for abdominal distention, abdominal pain, constipation and diarrhea.  Endocrine: Negative.   Genitourinary:  Negative for dysuria.  Musculoskeletal:  Negative for joint swelling and myalgias.  Skin:  Negative for rash.  Allergic/Immunologic: Negative.   Neurological:  Negative for weakness, light-headedness and numbness.  Psychiatric/Behavioral:  Negative for dysphoric mood and suicidal ideas.    Objective:  BP (!) 186/120   Pulse 69   Wt 180 lb 9.6 oz (81.9 kg)   SpO2 99%   BMI 27.46 kg/m   BP/Weight 12/29/2020 07/25/2019 9/45/0388  Systolic BP 828 003 491  Diastolic BP 791 505 94  Wt. (Lbs) 180.6 180.8 180  BMI 27.46 27.49 27.37      Physical Exam Constitutional:      Appearance: He is well-developed.  Cardiovascular:      Rate and Rhythm: Normal rate.     Heart sounds: Normal heart sounds. No murmur heard. Pulmonary:     Effort: Pulmonary effort is normal.     Breath sounds: Normal breath sounds. No wheezing or rales.  Chest:     Chest wall: No tenderness.  Abdominal:     General: Bowel sounds are normal. There is no distension.     Palpations: Abdomen is soft. There is no mass.     Tenderness: There is no abdominal tenderness.  Musculoskeletal:        General: Normal range of motion.     Right lower leg: No edema.     Left lower leg: No edema.  Neurological:     Mental Status: He is alert and oriented to person, place, and time.  Psychiatric:        Mood and Affect: Mood normal.    CMP Latest Ref Rng & Units 09/06/2019 10/04/2018 09/12/2017  Glucose 65 - 99 mg/dL 86 86 80  BUN 6 - 24 mg/dL '15 17 17  ' Creatinine 0.76 - 1.27 mg/dL 1.53(H) 1.77(H) 1.68(H)  Sodium 134 - 144 mmol/L 144 143 139  Potassium 3.5 - 5.2 mmol/L 4.3 4.4 4.4  Chloride 96 - 106 mmol/L 108(H) 102 105  CO2 20 - 29 mmol/L '24 23 23  ' Calcium 8.7 - 10.2 mg/dL 9.6 9.3 10.0  Total Protein 6.0 - 8.5 g/dL 6.7 6.7 7.2  Total Bilirubin 0.0 - 1.2 mg/dL 0.4 <0.2 0.4  Alkaline Phos 48 - 121 IU/L 60 60 59  AST 0 - 40 IU/L '24 26 22  ' ALT 0 - 44 IU/L '28 31 24    ' Lipid Panel     Component Value Date/Time   CHOL 246 (H) 09/06/2019 1405   TRIG 131 09/06/2019 1405   HDL 46 09/06/2019 1405   CHOLHDL 5.3 (H) 09/06/2019 1405   LDLCALC 176 (H) 09/06/2019 1405    CBC    Component Value Date/Time   WBC 7.5 09/23/2016 1116   RBC 4.51 09/23/2016 1116   HGB 12.8 (L) 09/30/2016 0405   HGB 13.9 07/08/2016 1104   HCT 38.8 (L) 09/30/2016 0405   HCT 41.5 07/08/2016 1104   PLT 307 09/23/2016 1116   PLT 263 07/08/2016 1104   MCV 91.6 09/23/2016 1116   MCV 91 07/08/2016 1104   MCH 30.2 09/23/2016 1116   MCHC 32.9 09/23/2016 1116   RDW 13.2 09/23/2016 1116   RDW 13.8 07/08/2016 1104   LYMPHSABS 2.7 07/08/2016 1104   EOSABS 0.1 07/08/2016 1104    BASOSABS 0.0 07/08/2016 1104    Lab Results  Component Value Date   HGBA1C 5.3 07/08/2016    Assessment & Plan:  1. Essential hypertension Uncontrolled due to running out of medications which I have refilled I will see him back in 1 month to reassess his blood pressure  Counseled on blood pressure goal of less than 130/80, low-sodium, DASH diet, medication compliance, 150 minutes of moderate intensity exercise per week. Discussed medication compliance, adverse effects. - CMP14+EGFR - amLODipine (NORVASC) 10 MG tablet; TAKE 1 TABLET (10 MG TOTAL) BY MOUTH DAILY.  Dispense: 90 tablet; Refill: 1  2. Mixed hyperlipidemia Uncontrolled Will check lipid panel in 3 months as his cholesterol is likely to be elevated due to running out of his statin Low-cholesterol diet - pravastatin (PRAVACHOL) 40 MG tablet; Take 1 tablet (40 mg total) by mouth daily.  Dispense: 90 tablet; Refill: 1  3. Need for hepatitis C screening test - HCV Ab w Reflex to Quant PCR  4. Screening for colon cancer - Ambulatory referral to Gastroenterology   Health Care Maintenance: Declines flu shot Meds ordered this encounter  Medications   amLODipine (NORVASC) 10 MG tablet    Sig: TAKE 1 TABLET (10 MG TOTAL) BY MOUTH DAILY.    Dispense:  90 tablet    Refill:  1   pravastatin (PRAVACHOL) 40 MG tablet    Sig: Take 1 tablet (40 mg total) by mouth daily.    Dispense:  90 tablet    Refill:  1    Discontinue previous dose    Follow-up: Return in about 1 month (around 01/29/2021) for Blood Pressure follow-up.       Charlott Rakes, MD, FAAFP. [facility] and [place], [place]   12/29/2020, 3:09 PM

## 2020-12-30 LAB — HCV AB W REFLEX TO QUANT PCR: HCV Ab: <0.1 s/co ratio (ref 0.0–0.9)

## 2020-12-30 LAB — CMP14+EGFR
ALT: 33 IU/L (ref 0–44)
AST: 24 IU/L (ref 0–40)
Albumin/Globulin Ratio: 1.8 (ref 1.2–2.2)
Albumin: 4.8 g/dL (ref 3.8–4.9)
Alkaline Phosphatase: 67 IU/L (ref 44–121)
BUN/Creatinine Ratio: 12 (ref 9–20)
BUN: 16 mg/dL (ref 6–24)
Bilirubin Total: 0.3 mg/dL (ref 0.0–1.2)
CO2: 23 mmol/L (ref 20–29)
Calcium: 10.1 mg/dL (ref 8.7–10.2)
Chloride: 104 mmol/L (ref 96–106)
Creatinine, Ser: 1.33 mg/dL — ABNORMAL HIGH (ref 0.76–1.27)
Globulin, Total: 2.6 g/dL (ref 1.5–4.5)
Glucose: 88 mg/dL (ref 70–99)
Potassium: 4.7 mmol/L (ref 3.5–5.2)
Sodium: 143 mmol/L (ref 134–144)
Total Protein: 7.4 g/dL (ref 6.0–8.5)
eGFR: 65 mL/min/{1.73_m2} (ref >59)

## 2020-12-30 LAB — HCV INTERPRETATION

## 2021-02-03 ENCOUNTER — Ambulatory Visit: Payer: Self-pay | Admitting: Family Medicine

## 2021-02-09 ENCOUNTER — Other Ambulatory Visit: Payer: Self-pay

## 2021-02-09 ENCOUNTER — Ambulatory Visit (AMBULATORY_SURGERY_CENTER): Payer: Self-pay | Admitting: *Deleted

## 2021-02-09 VITALS — Ht 67.5 in | Wt 178.0 lb

## 2021-02-09 DIAGNOSIS — Z1211 Encounter for screening for malignant neoplasm of colon: Secondary | ICD-10-CM

## 2021-02-09 MED ORDER — PEG 3350-KCL-NA BICARB-NACL 420 G PO SOLR
4000.0000 mL | Freq: Once | ORAL | 0 refills | Status: AC
  Filled 2021-02-09: qty 4000, 1d supply, fill #0

## 2021-02-09 NOTE — Progress Notes (Signed)

## 2021-02-13 ENCOUNTER — Other Ambulatory Visit: Payer: Self-pay

## 2021-02-23 ENCOUNTER — Encounter: Payer: Self-pay | Admitting: Gastroenterology

## 2021-03-18 ENCOUNTER — Other Ambulatory Visit: Payer: Self-pay

## 2021-04-01 ENCOUNTER — Other Ambulatory Visit: Payer: Self-pay

## 2021-04-01 ENCOUNTER — Encounter: Payer: Self-pay | Admitting: Family Medicine

## 2021-04-01 ENCOUNTER — Ambulatory Visit: Payer: BC Managed Care – PPO | Attending: Family Medicine | Admitting: Family Medicine

## 2021-04-01 VITALS — BP 170/109 | HR 60 | Ht 68.0 in | Wt 180.0 lb

## 2021-04-01 DIAGNOSIS — I10 Essential (primary) hypertension: Secondary | ICD-10-CM

## 2021-04-01 DIAGNOSIS — E782 Mixed hyperlipidemia: Secondary | ICD-10-CM

## 2021-04-01 DIAGNOSIS — Z1211 Encounter for screening for malignant neoplasm of colon: Secondary | ICD-10-CM

## 2021-04-01 MED ORDER — PRAVASTATIN SODIUM 40 MG PO TABS
40.0000 mg | ORAL_TABLET | Freq: Every day | ORAL | 1 refills | Status: AC
  Filled 2021-04-01 – 2021-05-13 (×2): qty 90, 90d supply, fill #0

## 2021-04-01 MED ORDER — AMLODIPINE BESYLATE 10 MG PO TABS
ORAL_TABLET | Freq: Every day | ORAL | 1 refills | Status: AC
  Filled 2021-04-01: qty 90, fill #0
  Filled 2021-04-29: qty 90, 90d supply, fill #0
  Filled 2021-08-05: qty 90, 90d supply, fill #1

## 2021-04-01 NOTE — Patient Instructions (Signed)
Managing Your Hypertension Hypertension, also called high blood pressure, is when the force of the blood pressing against the walls of the arteries is too strong. Arteries are blood vessels that carry blood from your heart throughout your body. Hypertension forces the heart to work harder to pump blood and may cause the arteries to become narrow or stiff. Understanding blood pressure readings Your personal target blood pressure may vary depending on your medical conditions, your age, and other factors. A blood pressure reading includes a higher number over a lower number. Ideally, your blood pressure should be below 120/80. You should know that: The first, or top, number is called the systolic pressure. It is a measure of the pressure in your arteries as your heart beats. The second, or bottom number, is called the diastolic pressure. It is a measure of the pressure in your arteries as the heart relaxes. Blood pressure is classified into four stages. Based on your blood pressure reading, your health care provider may use the following stages to determine what type of treatment you need, if any. Systolic pressure and diastolic pressure are measured in a unit called mmHg. Normal Systolic pressure: below 120. Diastolic pressure: below 80. Elevated Systolic pressure: 120-129. Diastolic pressure: below 80. Hypertension stage 1 Systolic pressure: 130-139. Diastolic pressure: 80-89. Hypertension stage 2 Systolic pressure: 140 or above. Diastolic pressure: 90 or above. How can this condition affect me? Managing your hypertension is an important responsibility. Over time, hypertension can damage the arteries and decrease blood flow to important parts of the body, including the brain, heart, and kidneys. Having untreated or uncontrolled hypertension can lead to: A heart attack. A stroke. A weakened blood vessel (aneurysm). Heart failure. Kidney damage. Eye damage. Metabolic syndrome. Memory and  concentration problems. Vascular dementia. What actions can I take to manage this condition? Hypertension can be managed by making lifestyle changes and possibly by taking medicines. Your health care provider will help you make a plan to bring your blood pressure within a normal range. Nutrition  Eat a diet that is high in fiber and potassium, and low in salt (sodium), added sugar, and fat. An example eating plan is called the Dietary Approaches to Stop Hypertension (DASH) diet. To eat this way: Eat plenty of fresh fruits and vegetables. Try to fill one-half of your plate at each meal with fruits and vegetables. Eat whole grains, such as whole-wheat pasta, brown rice, or whole-grain bread. Fill about one-fourth of your plate with whole grains. Eat low-fat dairy products. Avoid fatty cuts of meat, processed or cured meats, and poultry with skin. Fill about one-fourth of your plate with lean proteins such as fish, chicken without skin, beans, eggs, and tofu. Avoid pre-made and processed foods. These tend to be higher in sodium, added sugar, and fat. Reduce your daily sodium intake. Most people with hypertension should eat less than 1,500 mg of sodium a day. Lifestyle  Work with your health care provider to maintain a healthy body weight or to lose weight. Ask what an ideal weight is for you. Get at least 30 minutes of exercise that causes your heart to beat faster (aerobic exercise) most days of the week. Activities may include walking, swimming, or biking. Include exercise to strengthen your muscles (resistance exercise), such as weight lifting, as part of your weekly exercise routine. Try to do these types of exercises for 30 minutes at least 3 days a week. Do not use any products that contain nicotine or tobacco, such as cigarettes, e-cigarettes,   and chewing tobacco. If you need help quitting, ask your health care provider. Control any long-term (chronic) conditions you have, such as high  cholesterol or diabetes. Identify your sources of stress and find ways to manage stress. This may include meditation, deep breathing, or making time for fun activities. Alcohol use Do not drink alcohol if: Your health care provider tells you not to drink. You are pregnant, may be pregnant, or are planning to become pregnant. If you drink alcohol: Limit how much you use to: 0-1 drink a day for women. 0-2 drinks a day for men. Be aware of how much alcohol is in your drink. In the [place] one drink equals one 12 oz bottle of beer (355 mL), one 5 oz glass of wine (148 mL), or one 1 oz glass of hard liquor (44 mL). Medicines Your health care provider may prescribe medicine if lifestyle changes are not enough to get your blood pressure under control and if: Your systolic blood pressure is 130 or higher. Your diastolic blood pressure is 80 or higher. Take medicines only as told by your health care provider. Follow the directions carefully. Blood pressure medicines must be taken as told by your health care provider. The medicine does not work as well when you skip doses. Skipping doses also puts you at risk for problems. Monitoring Before you monitor your blood pressure: Do not smoke, drink caffeinated beverages, or exercise within 30 minutes before taking a measurement. Use the bathroom and empty your bladder (urinate). Sit quietly for at least 5 minutes before taking measurements. Monitor your blood pressure at home as told by your health care provider. To do this: Sit with your back straight and supported. Place your feet flat on the floor. Do not cross your legs. Support your arm on a flat surface, such as a table. Make sure your upper arm is at heart level. Each time you measure, take two or three readings one minute apart and record the results. You may also need to have your blood pressure checked regularly by your health care provider. General information Talk with your health care  provider about your diet, exercise habits, and other lifestyle factors that may be contributing to hypertension. Review all the medicines you take with your health care provider because there may be side effects or interactions. Keep all visits as told by your health care provider. Your health care provider can help you create and adjust your plan for managing your high blood pressure. Where to find more information National Heart, Lung, and Blood Institute: www.nhlbi.nih.gov American Heart Association: www.heart.org Contact a health care provider if: You think you are having a reaction to medicines you have taken. You have repeated (recurrent) headaches. You feel dizzy. You have swelling in your ankles. You have trouble with your vision. Get help right away if: You develop a severe headache or confusion. You have unusual weakness or numbness, or you feel faint. You have severe pain in your chest or abdomen. You vomit repeatedly. You have trouble breathing. These symptoms may represent a serious problem that is an emergency. Do not wait to see if the symptoms will go away. Get medical help right away. Call your local emergency services (911 in the [place] Do not drive yourself to the hospital. Summary Hypertension is when the force of blood pumping through your arteries is too strong. If this condition is not controlled, it may put you at risk for serious complications. Your personal target blood pressure may vary depending on   your medical conditions, your age, and other factors. For most people, a normal blood pressure is less than 120/80. Hypertension is managed by lifestyle changes, medicines, or both. Lifestyle changes to help manage hypertension include losing weight, eating a healthy, low-sodium diet, exercising more, stopping smoking, and limiting alcohol. This information is not intended to replace advice given to you by your health care provider. Make sure you discuss any questions  you have with your health care provider. Document Revised: 01/22/2019 Document Reviewed: 12/05/2018 Elsevier Patient Education  2022 Elsevier Inc.  

## 2021-04-01 NOTE — Progress Notes (Signed)
? ?Subjective:  ?[name]., male    DOB: October 18, 1970  Age: 51 y.o. MRN: [id number] ? ?CC: Hypertension ? ? ?HPI ?Dave Jimenez. is a 51 y.o. year old male with a history of  tobacco abuse, hypertension, hyperlipidemia, history of R nephrectomy for right kidney mass, chronic low back pain due to degenerative disc disease, herniated lumbar disc ? ?Interval History: ?His blood pressure is elevated today and he attributes this to arguing over the phone on his way here and is in fact receiving calls during his office visit which she states is causing him to be stressed.  He complains of being in a situation with 2 ladies and he needs to remove himself from the situation for his blood pressure to improve. ?132/83 at home this morning the patient and he stated he takes his antihypertensive regularly. ? ?He has been without Pravastatin x3 weeks and requests refill. ? ?I had referred him for colonoscopy but he states now that he has insurance [facility] is out of network and his BCBS requires he get an appointment with GI in network in [place] or [place] ?Past Medical History:  ?Diagnosis Date  ? Chronic kidney disease   ? kidney issue  ? Hyperlipidemia   ? Hypertension   ? ? ?Past Surgical History:  ?Procedure Laterality Date  ? LAPAROSCOPIC NEPHRECTOMY Right 09/29/2016  ? Procedure: LAPAROSCOPIC RADICAL  NEPHRECTOMY RIGHT;  Surgeon: Ardis Hughs, MD;  Location: [facility];  Service: Urology;  Laterality: Right;  ? MASTECTOMY    ? MASTECTOMY  1989  ? ? ?Family History  ?Problem Relation Age of Onset  ? Hypertension Mother   ? COPD Mother   ? Heart disease Father   ? Colon cancer Neg Hx   ? Colon polyps Neg Hx   ? ? ?Social History  ? ?Socioeconomic History  ? Marital status: Single  ?  Spouse name: Not on file  ? Number of children: Not on file  ? Years of education: Not on file  ? Highest education level: Not on file  ?Occupational History  ?  Employer: [employer]  ?  Comment: CNA   ?Tobacco Use  ? Smoking status: Some Days  ?  Types: Cigars  ? Smokeless tobacco: Never  ?Vaping Use  ? Vaping Use: Never used  ?Substance and Sexual Activity  ? Alcohol use: Yes  ?  Comment: drinks beer and liquor  ? Drug use: Yes  ?  Frequency: 4.0 times per week  ?  Types: Marijuana  ? Sexual activity: Yes  ?Other Topics Concern  ? Not on file  ?Social History Narrative  ? Not on file  ? ?Social Determinants of Health  ? ?Financial Resource Strain: Not on file  ?Food Insecurity: Not on file  ?Transportation Needs: Not on file  ?Physical Activity: Not on file  ?Stress: Not on file  ?Social Connections: Not on file  ? ? ?No Known Allergies ? ?Outpatient Medications Prior to Visit  ?Medication Sig Dispense Refill  ? amLODipine (NORVASC) 10 MG tablet TAKE 1 TABLET (10 MG TOTAL) BY MOUTH DAILY. 90 tablet 1  ? pravastatin (PRAVACHOL) 40 MG tablet Take 1 tablet (40 mg total) by mouth daily. 90 tablet 1  ? ?No facility-administered medications prior to visit.  ? ? ? ?ROS ?Review of Systems  ?Constitutional:  Negative for activity change and appetite change.  ?HENT:  Negative for sinus pressure and sore throat.   ?Eyes:  Negative for visual  disturbance.  ?Respiratory:  Negative for cough, chest tightness and shortness of breath.   ?Cardiovascular:  Negative for chest pain and leg swelling.  ?Gastrointestinal:  Negative for abdominal distention, abdominal pain, constipation and diarrhea.  ?Endocrine: Negative.   ?Genitourinary:  Negative for dysuria.  ?Musculoskeletal:  Negative for joint swelling and myalgias.  ?Skin:  Negative for rash.  ?Allergic/Immunologic: Negative.   ?Neurological:  Negative for weakness, light-headedness and numbness.  ?Psychiatric/Behavioral:  Negative for dysphoric mood and suicidal ideas.   ? ?Objective:  ?BP (!) 170/109   Pulse 60   Ht '5\' 8"'  (1.727 m)   Wt 180 lb (81.6 kg)   SpO2 100%   BMI 27.37 kg/m?  ? ?BP/Weight 04/01/2021 02/09/2021 12/29/2020  ?Systolic BP 287 - 867  ?Diastolic BP 672  - 094  ?Wt. (Lbs) 180 178 180.6  ?BMI 27.37 27.47 27.46  ? ? ? ? ?Physical Exam ?Constitutional:   ?   Appearance: He is well-developed.  ?Cardiovascular:  ?   Rate and Rhythm: Normal rate.  ?   Heart sounds: Normal heart sounds. No murmur heard. ?Pulmonary:  ?   Effort: Pulmonary effort is normal.  ?   Breath sounds: Normal breath sounds. No wheezing or rales.  ?Chest:  ?   Chest wall: No tenderness.  ?Abdominal:  ?   General: Bowel sounds are normal. There is no distension.  ?   Palpations: Abdomen is soft. There is no mass.  ?   Tenderness: There is no abdominal tenderness.  ?Musculoskeletal:     ?   General: Normal range of motion.  ?   Right lower leg: No edema.  ?   Left lower leg: No edema.  ?Neurological:  ?   Mental Status: He is alert and oriented to person, place, and time.  ?Psychiatric:     ?   Mood and Affect: Mood normal.  ? ? ?CMP Latest Ref Rng & Units 12/29/2020 09/06/2019 10/04/2018  ?Glucose 70 - 99 mg/dL 88 86 86  ?BUN 6 - 24 mg/dL '16 15 17  ' ?Creatinine 0.76 - 1.27 mg/dL 1.33(H) 1.53(H) 1.77(H)  ?Sodium 134 - 144 mmol/L 143 144 143  ?Potassium 3.5 - 5.2 mmol/L 4.7 4.3 4.4  ?Chloride 96 - 106 mmol/L 104 108(H) 102  ?CO2 20 - 29 mmol/L '23 24 23  ' ?Calcium 8.7 - 10.2 mg/dL 10.1 9.6 9.3  ?Total Protein 6.0 - 8.5 g/dL 7.4 6.7 6.7  ?Total Bilirubin 0.0 - 1.2 mg/dL 0.3 0.4 <0.2  ?Alkaline Phos 44 - 121 IU/L 67 60 60  ?AST 0 - 40 IU/L '24 24 26  ' ?ALT 0 - 44 IU/L 33 28 31  ? ? ?Lipid Panel  ?   ?Component Value Date/Time  ? CHOL 246 (H) 09/06/2019 1405  ? TRIG 131 09/06/2019 1405  ? HDL 46 09/06/2019 1405  ? CHOLHDL 5.3 (H) 09/06/2019 1405  ? LDLCALC 176 (H) 09/06/2019 1405  ? ? ?CBC ?   ?Component Value Date/Time  ? WBC 7.5 09/23/2016 1116  ? RBC 4.51 09/23/2016 1116  ? HGB 12.8 (L) 09/30/2016 0405  ? HGB 13.9 07/08/2016 1104  ? HCT 38.8 (L) 09/30/2016 0405  ? HCT 41.5 07/08/2016 1104  ? PLT 307 09/23/2016 1116  ? PLT 263 07/08/2016 1104  ? MCV 91.6 09/23/2016 1116  ? MCV 91 07/08/2016 1104  ? MCH 30.2  09/23/2016 1116  ? MCHC 32.9 09/23/2016 1116  ? RDW 13.2 09/23/2016 1116  ? RDW 13.8 07/08/2016 1104  ? LYMPHSABS  2.7 07/08/2016 1104  ? EOSABS 0.1 07/08/2016 1104  ? BASOSABS 0.0 07/08/2016 1104  ? ? ?Lab Results  ?Component Value Date  ? HGBA1C 5.3 07/08/2016  ? ? ?Assessment & Plan:  ?1. Essential hypertension ?Uncontrolled ?Repeat blood pressure is even higher than initial blood pressure ?He kept on texting and responding to messages on his phone while waiting in the room for blood pressure recheck ?Advised that I will need to place him on a low-dose second antihypertensive which he declines as he states his blood pressures at home are normal ?He is currently stressed which could explain his elevated blood pressure but at his last office visit blood pressure was also elevated. ?I will see him back in 1 month to reassess ?Counseled on blood pressure goal of less than 130/80, low-sodium, DASH diet, medication compliance, 150 minutes of moderate intensity exercise per week. ?Discussed medication compliance, adverse effects. ?- CMP14+EGFR ?- LP+Non-HDL Cholesterol ?- amLODipine (NORVASC) 10 MG tablet; TAKE 1 TABLET (10 MG TOTAL) BY MOUTH DAILY.  Dispense: 90 tablet; Refill: 1 ? ?2. Screening for colon cancer ?- Ambulatory referral to Gastroenterology ? ?3. Mixed hyperlipidemia ?If lipid panel is uncontrolled I will make no regimen change given he has been out of his pravastatin ?Low-cholesterol diet ?- pravastatin (PRAVACHOL) 40 MG tablet; Take 1 tablet (40 mg total) by mouth daily.  Dispense: 90 tablet; Refill: 1 ? ? ?Meds ordered this encounter  ?Medications  ? amLODipine (NORVASC) 10 MG tablet  ?  Sig: TAKE 1 TABLET (10 MG TOTAL) BY MOUTH DAILY.  ?  Dispense:  90 tablet  ?  Refill:  1  ? pravastatin (PRAVACHOL) 40 MG tablet  ?  Sig: Take 1 tablet (40 mg total) by mouth daily.  ?  Dispense:  90 tablet  ?  Refill:  1  ?  Discontinue previous dose  ? ? ?Follow-up: Return in about 1 month (around 05/02/2021) for Blood  Pressure follow-up.  ? ? ? ? ? ?Charlott Rakes, MD, FAAFP. ?[address] ?[place], [place] ?[phone]   ?04/01/2021, 5:35 PM ?

## 2021-04-02 LAB — CMP14+EGFR
ALT: 57 IU/L — ABNORMAL HIGH (ref 0–44)
AST: 42 IU/L — ABNORMAL HIGH (ref 0–40)
Albumin/Globulin Ratio: 2.2 (ref 1.2–2.2)
Albumin: 4.7 g/dL (ref 3.8–4.9)
Alkaline Phosphatase: 66 IU/L (ref 44–121)
BUN/Creatinine Ratio: 10 (ref 9–20)
BUN: 14 mg/dL (ref 6–24)
Bilirubin Total: 0.3 mg/dL (ref 0.0–1.2)
CO2: 24 mmol/L (ref 20–29)
Calcium: 9.6 mg/dL (ref 8.7–10.2)
Chloride: 104 mmol/L (ref 96–106)
Creatinine, Ser: 1.36 mg/dL — ABNORMAL HIGH (ref 0.76–1.27)
Globulin, Total: 2.1 g/dL (ref 1.5–4.5)
Glucose: 82 mg/dL (ref 70–99)
Potassium: 4.1 mmol/L (ref 3.5–5.2)
Sodium: 142 mmol/L (ref 134–144)
Total Protein: 6.8 g/dL (ref 6.0–8.5)
eGFR: 63 mL/min/{1.73_m2} (ref >59)

## 2021-04-02 LAB — LP+NON-HDL CHOLESTEROL
Cholesterol, Total: 252 mg/dL — ABNORMAL HIGH (ref 100–199)
HDL: 52 mg/dL (ref >39)
LDL Chol Calc (NIH): 183 mg/dL — ABNORMAL HIGH (ref 0–99)
Total Non-HDL-Chol (LDL+VLDL): 200 mg/dL — ABNORMAL HIGH (ref 0–129)
Triglycerides: 99 mg/dL (ref 0–149)
VLDL Cholesterol Cal: 17 mg/dL (ref 5–40)

## 2021-04-15 ENCOUNTER — Other Ambulatory Visit: Payer: Self-pay

## 2021-04-29 ENCOUNTER — Other Ambulatory Visit: Payer: Self-pay

## 2021-05-07 ENCOUNTER — Ambulatory Visit: Payer: BC Managed Care – PPO | Admitting: Family Medicine

## 2021-05-13 ENCOUNTER — Telehealth: Payer: Self-pay | Admitting: Student-PharmD

## 2021-05-13 ENCOUNTER — Other Ambulatory Visit: Payer: Self-pay

## 2021-05-13 NOTE — Telephone Encounter (Signed)
Patient appearing on report for [facility] - Hypertension Control report due to last documented ambulatory blood pressure of 170/109 on 04/01/21 which the patient attributed to being very stressed during the visit. He reported adherence with amlodipine. It was recommended to add a second antihypertensive which the patient declined since he reported normal home readings. Next appointment with PCP was scheduled for 05/07/21 for BP follow up but the patient no showed.   ? ?Outreached patient to discuss hypertension control and medication management.  ? ?Current antihypertensives: amlodipine 10 mg daily ? ?Unable to reach patient. LVM requesting call back.  ? ?Dave Jimenez, PharmD ?PGY2 Ambulatory Care Pharmacy Resident ?05/13/2021 9:55 AM ?

## 2021-05-13 NOTE — Telephone Encounter (Signed)
Patient returned call. Reports that he is taking amlodipine daily and staying on top of refilling it on time. Reports that he just finished a 16 hour shift (works as a [employer] scientist) and will call me back shortly with an updated home BP.  ?

## 2021-05-13 NOTE — Telephone Encounter (Signed)
Spoke with patient who reports adherence with amlodipine. He checks his BP daily at home and reports it has been much improved. States that his BP was high at his last visit because he was dealing with a lot of stress but that his stress has really improved. He missed his appt with Dr. Alvis Lemmings for a BP check 05/07/21 and would like to reschedule with her. She is booked out until August. Offered visit with [facility] clinical pharmacist to check BP before then which the patient said he would appreciate.  ? ?He says he is doing good on his supply of amlodipine but does need more pravastatin. Refills have been sent for this medication, and I will ask the pharmacy to get this ready for him.  ? ?Current antihypertensives: amlodipine 10 mg daily ? ?Patient has an automated upper arm home BP machine. ? ?Current blood pressure readings: 138/72 today which was before taking his amlodipine, says it is lower than this after he takes it ? ?Patient denies hypotensive signs and symptoms including dizziness, lightheadedness.  ?Patient denies hypertensive symptoms including headache, chest pain, shortness of breath. ? ?Patient denies side effects related to amlodipine.   ? ?Assessment/Plan: ?- Currently uncontrolled but improving per patient report of home BP readings ?- Reviewed goal blood pressure <130/80 ?- Reviewed appropriate administration of medication regimen ?- Reviewed appropriate home BP monitoring technique (avoid caffeine, smoking, and exercise for 30 minutes before checking, rest for at least 5 minutes before taking BP, sit with feet flat on the floor and back against a hard surface, uncross legs, and rest arm on flat surface) ?- Reviewed to check blood pressure daily, document, and provide at next provider visit ?- Encouraged continued adherence with amlodipine and to be sure to take it prior to his upcoming visits  ?- Scheduled visit with [facility] clinical pharmacist on 06/11/21 ?- Scheduled visit with PCP on 08/25/21 ? ?Pervis Hocking, PharmD ?PGY2 Ambulatory Care Pharmacy Resident ?05/13/2021 2:27 PM ?

## 2021-05-18 ENCOUNTER — Other Ambulatory Visit: Payer: Self-pay

## 2021-05-20 ENCOUNTER — Other Ambulatory Visit: Payer: Self-pay

## 2021-06-11 ENCOUNTER — Ambulatory Visit: Payer: BC Managed Care – PPO | Admitting: Pharmacist

## 2021-06-30 ENCOUNTER — Ambulatory Visit: Payer: BC Managed Care – PPO | Attending: Family Medicine | Admitting: Pharmacist

## 2021-06-30 ENCOUNTER — Other Ambulatory Visit: Payer: Self-pay

## 2021-06-30 VITALS — BP 150/97 | HR 67

## 2021-06-30 DIAGNOSIS — I10 Essential (primary) hypertension: Secondary | ICD-10-CM

## 2021-06-30 MED ORDER — VALSARTAN 40 MG PO TABS
40.0000 mg | ORAL_TABLET | Freq: Every day | ORAL | 0 refills | Status: DC
  Filled 2021-06-30: qty 30, 30d supply, fill #0

## 2021-06-30 NOTE — Progress Notes (Unsigned)
S:    Dave Jimenez. is a 51 y.o. male who presents for hypertension evaluation, education, and management. PMH is significant for HTN, HLD, renal mass, and unilateral nephrectomy 2018. Patient was referred by [facility] for blood pressure management. Patient was last seen by Primary Care Provider, Dr. Alvis Lemmings, on 04/01/2021 where BP was elvated to 170/109 which was attributed to stress. On 05/13/21, clinical pharmacist reached out to patient which home blood pressure reported as 138/72. Pharmacist encourage amlodipine adherence and remind patient of upcoming appointment.   Today, patient arrives in good spirits and presents without assistance. Patient strongly wishes to avoid more blood pressure medications and does not want to take medications more than once per day. Denies dizziness, headache, blurred vision, swelling.   Family/Social history: Smokes [employer] and occasional marijuana   Medication adherence appropriate. Patient has taken BP medications today around ~12:45 pm (2 hours prior to appointment).   Current antihypertensives include:  Amlodipine 10mg  daily   Reported home BP readings: 119/78 HR 64 on 06/28/2021, no other reading today  Patient reported dietary habits: high salt in diet, noted to have burger lots of seasoning/salt today and heavily seasoned fish for dinner last night Patient-reported exercise habits: staying active at home  O:  Last 3 Office BP readings: BP Readings from Last 3 Encounters:  06/30/21 (!) 150/97  04/01/21 (!) 170/109  12/29/20 (!) 186/120  Admits to salty lunch and smoking this morning  BMET    Component Value Date/Time   NA 142 04/01/2021 1631   K 4.1 04/01/2021 1631   CL 104 04/01/2021 1631   CO2 24 04/01/2021 1631   GLUCOSE 82 04/01/2021 1631   GLUCOSE 95 09/30/2016 0405   BUN 14 04/01/2021 1631   CREATININE 1.36 (H) 04/01/2021 1631   CALCIUM 9.6 04/01/2021 1631   GFRNONAA 52 (L) 09/06/2019 1405   GFRAA 60 09/06/2019  1405   Renal function: CrCl cannot be calculated (Patient's most recent lab result is older than the maximum 21 days allowed.).  Clinical ASCVD:  The 10-year ASCVD risk score (Arnett DK, et al., 2019) is: 22.2%   Values used to calculate the score:     Age: 3 years     Sex: Male     Is Non-Hispanic African American: Yes     Diabetic: No     Tobacco smoker: Yes     Systolic Blood Pressure: 150 mmHg     Is BP treated: Yes     HDL Cholesterol: 52 mg/dL     Total Cholesterol: 252 mg/dL  A/P: Hypertension diagnosed currently uncontrolled on current medications. BP goal < 130/80 mmHg. Medication adherence appears appropriate. Patient does admit to some lifestyle risk factors for increasing blood pressure including smoking, caffeine use, and high salt diet. He has been trying to cut back on smoking and increase exercise.  -Start Valsartan 40mg  daily for blood pressure lowering and renal protection. Patient agreed to trial ACEi therapy to see if it would help blood pressure, but ultimately would like to be on one medication for blood pressure. Discussed the importance of appropriate blood pressure to prevent damage to patient's remaining kidney. Overall, data supports RAAS inhibition in patients with acquired solitary kidney (Simeoni M, Armeni A, Summaria C, Cerantonio A, Fuiano G. Current evidence on the use of anti-RAAS agents in congenital or acquired solitary kidney. Ren Fail. 2017 Nov;39(1):660-670. doi: 10.1080/0886022 X.2017.1361840. PMID: 2018; PMCID07-17-2001.). -Patient educated on purpose, proper use, and potential adverse effects of  valsartan.  -F/u labs ordered - CMP follow up in 2 weeks post RASS agent initiation. Stressed the importance of patient making this appointment and he agreed to be present as he will be off work that day.  -Counseled on lifestyle modifications for blood pressure control including reduced dietary sodium, increased exercise, adequate sleep. -Encouraged  patient to check BP at home and bring log of readings to next visit. Counseled on proper use of home BP cuff.   Results reviewed and written information provided. Patient verbalized understanding of treatment plan. Total time in face-to-face counseling 35 minutes.   F/u clinic visit in 2 weeks for lab work.   Thank you for allowing pharmacy to participate in this patient's care.  Marja Kays, PharmD PGY1 Acute Care Resident  06/30/2021,5:16 PM

## 2021-07-09 ENCOUNTER — Telehealth: Payer: Self-pay | Admitting: Pharmacist

## 2021-07-09 NOTE — Telephone Encounter (Signed)
Patient attempted to be outreached by Jacolyn Reedy, PharmD Candidate on 07/08/21 to discuss hypertension. Left voicemail for patient to return our call at his convenience.     Catie Eppie Gibson, PharmD, [facility] [phone]

## 2021-07-14 ENCOUNTER — Ambulatory Visit: Payer: BC Managed Care – PPO | Admitting: Pharmacist

## 2021-07-29 ENCOUNTER — Ambulatory Visit: Payer: Self-pay | Admitting: *Deleted

## 2021-07-29 ENCOUNTER — Other Ambulatory Visit: Payer: Self-pay

## 2021-07-29 NOTE — Telephone Encounter (Signed)
Er agent: "The patient has recently been prescribed Rx #: [id number]  valsartan (DIOVAN) 40 MG tablet [[id number]]   The patient is concerned with the side effects of the medication   The patient has experienced headaches, cold and flu like symptoms and urinary discomfort   The patient has been recently informed of the medication being pulled off the shelves of some pharmacies   The patient would like to speak with a member of staff about this further"   . Chief Complaint:States med reaction Symptoms: Headache, urine dark, achy, Frequency: "Since taking Diovan" Started 06/29/21 Pertinent Negatives: Patient denies  Disposition: [] ED /[] Urgent Care (no appt availability in office) / [] Appointment(In office/virtual)/ []  [facility]/ [] Home Care/ [] Refused Recommended Disposition /[] [facility] [facility]/ [x]  Follow-up with PCP Additional Notes: States he has not taken in 2-3 days. States he will not take the med again. "Never felt so bad. And I have one kidney." BP today 128/72. Assured pt NT would route to practice for PCPs review.  Reason for Disposition  [1] Caller has NON-URGENT medicine question about med that PCP prescribed AND [2] triager unable to answer question  Protocols used: Medication Question Call-A-AH

## 2021-07-30 NOTE — Telephone Encounter (Signed)
Called and patient is concerned with new medication and its reaction   States that he stop it, 4 days now   Concern and is asking why is given theses medication when he has one kidney.

## 2021-07-30 NOTE — Telephone Encounter (Signed)
Can you please give him a call to reassure him. People with a solitary kidney can still take Valsartan. We will monitor his kidney function regularly. His uncontrolled BP can cause kidney failure.

## 2021-08-05 ENCOUNTER — Other Ambulatory Visit: Payer: Self-pay

## 2021-08-06 ENCOUNTER — Other Ambulatory Visit: Payer: Self-pay

## 2021-08-07 ENCOUNTER — Ambulatory Visit: Payer: Self-pay

## 2021-08-07 NOTE — Telephone Encounter (Signed)
FYI, patient has upcoming appointment 08/10/2021

## 2021-08-07 NOTE — Telephone Encounter (Signed)
  Chief Complaint: HA off and on for 5 days Symptoms: HA   Frequency: Every evening  Pertinent Negatives: Patient denies fever, HTN, weakness Disposition: [] ED /[] Urgent Care (no appt availability in office) / [x] Appointment(In office/virtual)/ []  [place]/ [] Home Care/ [] Refused Recommended Disposition /[] [place]/ []  Follow-up with PCP Additional Notes: PT states that he stopped taking valsartan  7/12 d/t side effects. PT states that his urine turned very dark. PT does not want to take this medication as he feels it is not safe for his 1 kidney. Pt states that he only has HTN when visiting the PCP office. He is willing to take a higher dose of amlodipine or take it 2 times daily if prescribed.  Pt states that he gets a HA every evening. Last night he took a BC powder which helped his HA. He has also 2 Tylenol for HA which has decreased pain.  When pt called he had a HA and had just taken 2 Tylenol, HA was resolving by the end of the conversation. Pt took BP before call: 137/67, During call while talking BP was 149/95, and again while not talking 133/82.  PT will go to UC or ED if needed before OV. Reason for Disposition  Headache is a chronic symptom (recurrent or ongoing AND present > 4 weeks)  Answer Assessment - Initial Assessment Questions 1. LOCATION: "Where does it hurt?"      Right side at temple 2. ONSET: "When did the headache start?" (Minutes, hours or days)      8:30 to 9 pm gets a HA for 5 days and takes tylenol  - took a BC last night which helped 3. PATTERN: "Does the pain come and go, or has it been constant since it started?"     Comes and goes 4. SEVERITY: "How bad is the pain?" and "What does it keep you from doing?"  (e.g., Scale 1-10; mild, moderate, or severe)   - MILD (1-3): doesn't interfere with normal activities    - MODERATE (4-7): interferes with normal activities or awakens from sleep    - SEVERE (8-10): excruciating pain, unable to do  any normal activities        4.5/10 5. RECURRENT SYMPTOM: "Have you ever had headaches before?" If Yes, ask: "When was the last time?" and "What happened that time?"      Every night 6. CAUSE: "What do you think is causing the headache?"     unsure 7. MIGRAINE: "Have you been diagnosed with migraine headaches?" If Yes, ask: "Is this headache similar?"      no 8. HEAD INJURY: "Has there been any recent injury to the head?"      no 9. OTHER SYMPTOMS: "Do you have any other symptoms?" (fever, stiff neck, eye pain, sore throat, cold symptoms)     2 weeks ago  had a cold. Had congestion. Was quite sick on July 4th. 10. PREGNANCY: "Is there any chance you are pregnant?" "When was your last menstrual period?"       Na  Protocols used: Headache-A-AH

## 2021-08-07 NOTE — Telephone Encounter (Signed)
Will address at OV 

## 2021-08-10 ENCOUNTER — Encounter: Payer: Self-pay | Admitting: Internal Medicine

## 2021-08-10 ENCOUNTER — Other Ambulatory Visit: Payer: Self-pay

## 2021-08-10 ENCOUNTER — Ambulatory Visit: Payer: BC Managed Care – PPO | Attending: Internal Medicine | Admitting: Internal Medicine

## 2021-08-10 VITALS — BP 158/92 | HR 68 | Ht 68.0 in | Wt 184.4 lb

## 2021-08-10 DIAGNOSIS — I10 Essential (primary) hypertension: Secondary | ICD-10-CM

## 2021-08-10 MED ORDER — LISINOPRIL 5 MG PO TABS
5.0000 mg | ORAL_TABLET | Freq: Every day | ORAL | 3 refills | Status: AC
  Filled 2021-08-10: qty 30, 30d supply, fill #0

## 2021-08-10 NOTE — Progress Notes (Signed)
Not taking Valsartan due to him having 1 kidney. Wants to discuss increasing his Amlodipine.

## 2021-08-10 NOTE — Progress Notes (Signed)
Patient ID: Dave Kilner., male    DOB: [birth year]  MRN: [id number]  CC: Hypertension   Subjective: Dave Jimenez is a 51 y.o. male who presents for UC visit His concerns today include:  HTN, HL  Pt supposed to be on Norvasc 10 mg daily and valsartan 40 mg daily for blood pressure.  He was started on the valsartan 06/30/2021 by the clinical pharmacist.   Reports Valsartan caused HA and made his urine dark. He stopped the med as of the 6th of this mth and does not wish to get back on it. He has BP readings with him since being off Valsartan:  140/72, 138/78, 140/68, 137/95.    Patient Active Problem List   Diagnosis Date Noted   Essential hypertension 02/09/2017   Decreased calculated GFR 02/09/2017   Mixed hyperlipidemia 11/17/2016   History of nephrectomy, unilateral 10/27/2016   DDD (degenerative disc disease), lumbar 10/27/2016   Renal mass 09/29/2016     Current Outpatient Medications on File Prior to Visit  Medication Sig Dispense Refill   amLODipine (NORVASC) 10 MG tablet TAKE 1 TABLET (10 MG TOTAL) BY MOUTH DAILY. 90 tablet 1   pravastatin (PRAVACHOL) 40 MG tablet Take 1 tablet (40 mg total) by mouth daily. 90 tablet 1   No current facility-administered medications on file prior to visit.    No Known Allergies  Social History   Socioeconomic History   Marital status: Single    Spouse name: Not on file   Number of children: Not on file   Years of education: Not on file   Highest education level: Not on file  Occupational History    Employer: [employer]    Comment: CNA  Tobacco Use   Smoking status: Some Days    Types: Cigars   Smokeless tobacco: Never  Vaping Use   Vaping Use: Never used  Substance and Sexual Activity   Alcohol use: Yes    Comment: drinks beer and liquor   Drug use: Yes    Frequency: 4.0 times per week    Types: Marijuana   Sexual activity: Yes  Other Topics Concern   Not on file  Social History Narrative   Not  on file   Social Determinants of Health   Financial Resource Strain: Not on file  Food Insecurity: Not on file  Transportation Needs: Not on file  Physical Activity: Not on file  Stress: Not on file  Social Connections: Not on file  Intimate Partner Violence: Not on file    Family History  Problem Relation Age of Onset   Hypertension Mother    COPD Mother    Heart disease Father    Colon cancer Neg Hx    Colon polyps Neg Hx     Past Surgical History:  Procedure Laterality Date   LAPAROSCOPIC NEPHRECTOMY Right 09/29/2016   Procedure: LAPAROSCOPIC RADICAL  NEPHRECTOMY RIGHT;  Surgeon: Crist Fat, MD;  Location: [facility];  Service: Urology;  Laterality: Right;   MASTECTOMY     MASTECTOMY  1989    ROS: Review of Systems Negative except as stated above  PHYSICAL EXAM: BP (!) 158/92   Pulse 68   Ht 5\' 8"  (1.727 m)   Wt 184 lb 6.4 oz (83.6 kg)   SpO2 97%   BMI 28.04 kg/m   Physical Exam   General appearance - alert, well appearing, middle-aged African-American male and in no distress Chest - clear to auscultation, no wheezes, rales  or rhonchi, symmetric air entry Heart - normal rate, regular rhythm, normal S1, S2, no murmurs, rubs, clicks or gallops Extremities - peripheral pulses normal, no pedal edema, no clubbing or cyanosis     Latest Ref Rng & Units 04/01/2021    4:31 PM 12/29/2020    3:15 PM 09/06/2019    2:05 PM  CMP  Glucose 70 - 99 mg/dL 82  88  86   BUN 6 - 24 mg/dL 14  16  15    Creatinine 0.76 - 1.27 mg/dL  0.10  9.32   Sodium 134 - 144 mmol/L 142  143  144   Potassium 3.5 - 5.2 mmol/L 4.1  4.7  4.3   Chloride 96 - 106 mmol/L 104  104  108   CO2 20 - 29 mmol/L 24  23  24    Calcium 8.7 - 10.2 mg/dL 9.6  3.55  9.6   Total Protein 6.0 - 8.5 g/dL 6.8  7.4  6.7   Total Bilirubin 0.0 - 1.2 mg/dL 0.3  0.3  0.4   Alkaline Phos 44 - 121 IU/L 66  67  60   AST 0 - 40 IU/L 42  24  24   ALT 0 - 44 IU/L 57  33  28    Lipid Panel     Component  Value Date/Time   CHOL 252 (H) 04/01/2021 1631   TRIG 99 04/01/2021 1631   HDL 52 04/01/2021 1631   CHOLHDL 5.3 (H) 09/06/2019 1405   LDLCALC 183 (H) 04/01/2021 1631    CBC    Component Value Date/Time   WBC 7.5 09/23/2016 1116   RBC 4.51 09/23/2016 1116   HGB 12.8 (L) 09/30/2016 0405   HGB 13.9 07/08/2016 1104   HCT 38.8 (L) 09/30/2016 0405   HCT 41.5 07/08/2016 1104   PLT 307 09/23/2016 1116   PLT 263 07/08/2016 1104   MCV 91.6 09/23/2016 1116   MCV 91 07/08/2016 1104   MCH 30.2 09/23/2016 1116   MCHC 32.9 09/23/2016 1116   RDW 13.2 09/23/2016 1116   RDW 13.8 07/08/2016 1104   LYMPHSABS 2.7 07/08/2016 1104   EOSABS 0.1 07/08/2016 1104   BASOSABS 0.0 07/08/2016 1104    ASSESSMENT AND PLAN: 1. Essential hypertension Not at goal which is 130/80 or lower. We will have him stop the valsartan.  Continue Norvasc 10 mg daily.  Advised patient that he is on the maximum dose of the Norvasc.  Recommend that we add another blood pressure medication in the form of lisinopril 5 mg daily.  Advised that the medication sometimes can cause swelling of the lips or tongue.  If this occurs, advised that he stop the medication and be seen in the emergency room. DASH diet discussed and encouraged. Keep his upcoming appointment with PCP on the eighth of next month. - lisinopril (ZESTRIL) 5 MG tablet; Take 1 tablet (5 mg total) by mouth daily.  Dispense: 30 tablet; Refill: 3    Patient was given the opportunity to ask questions.  Patient verbalized understanding of the plan and was able to repeat key elements of the plan.   This documentation was completed using 07/10/2016.  Any transcriptional errors are unintentional.  No orders of the defined types were placed in this encounter.    Requested Prescriptions   Signed Prescriptions Disp Refills   lisinopril (ZESTRIL) 5 MG tablet 30 tablet 3    Sig: Take 1 tablet (5 mg total) by mouth daily.  No follow-ups on  file.  Jonah Blue, MD, FACP

## 2021-08-10 NOTE — Patient Instructions (Signed)
Stop valsartan. Start lisinopril 5 mg daily.  Continue amlodipine 10 mg daily. Try to limit the salt in the foods is much as possible.

## 2021-08-24 ENCOUNTER — Telehealth: Payer: Self-pay | Admitting: Pharmacist

## 2021-08-24 NOTE — Telephone Encounter (Signed)
Attempted to call patient to discuss hypertension prior to his visit tomorrow with PCP. Left voicemail for patient to return my call at his convenience. Reminded of appointment time tomorrow.

## 2021-08-25 ENCOUNTER — Ambulatory Visit: Payer: BC Managed Care – PPO | Admitting: Family Medicine

## 2021-10-07 ENCOUNTER — Ambulatory Visit: Payer: BC Managed Care – PPO | Admitting: Critical Care Medicine
